# Patient Record
Sex: Female | Born: 1950 | Race: White | Hispanic: No | Marital: Married | Smoking: Former smoker
Health system: Southern US, Community
[De-identification: ages and names within clinical notes are randomized; demographics above are authoritative.]

## PROBLEM LIST (undated history)

## (undated) DIAGNOSIS — N809 Endometriosis, unspecified: Secondary | ICD-10-CM

## (undated) DIAGNOSIS — M810 Age-related osteoporosis without current pathological fracture: Secondary | ICD-10-CM

## (undated) DIAGNOSIS — J45909 Unspecified asthma, uncomplicated: Secondary | ICD-10-CM

## (undated) DIAGNOSIS — R7611 Nonspecific reaction to tuberculin skin test without active tuberculosis: Secondary | ICD-10-CM

## (undated) DIAGNOSIS — T7840XA Allergy, unspecified, initial encounter: Secondary | ICD-10-CM

## (undated) DIAGNOSIS — H9319 Tinnitus, unspecified ear: Secondary | ICD-10-CM

## (undated) DIAGNOSIS — K589 Irritable bowel syndrome without diarrhea: Secondary | ICD-10-CM

## (undated) DIAGNOSIS — Z973 Presence of spectacles and contact lenses: Secondary | ICD-10-CM

## (undated) DIAGNOSIS — G43909 Migraine, unspecified, not intractable, without status migrainosus: Secondary | ICD-10-CM

## (undated) DIAGNOSIS — G629 Polyneuropathy, unspecified: Secondary | ICD-10-CM

## (undated) DIAGNOSIS — Z889 Allergy status to unspecified drugs, medicaments and biological substances status: Secondary | ICD-10-CM

## (undated) DIAGNOSIS — M1990 Unspecified osteoarthritis, unspecified site: Secondary | ICD-10-CM

## (undated) DIAGNOSIS — J349 Unspecified disorder of nose and nasal sinuses: Secondary | ICD-10-CM

## (undated) HISTORY — PX: LARYNGOSCOPY: SUR817

## (undated) HISTORY — PX: ELBOW FRACTURE SURGERY: SHX616

## (undated) HISTORY — DX: Allergy, unspecified, initial encounter: T78.40XA

## (undated) HISTORY — PX: MULTIPLE TOOTH EXTRACTIONS: SHX2053

## (undated) HISTORY — DX: Unspecified asthma, uncomplicated: J45.909

## (undated) HISTORY — DX: Migraine, unspecified, not intractable, without status migrainosus: G43.909

## (undated) HISTORY — DX: Nonspecific reaction to tuberculin skin test without active tuberculosis: R76.11

## (undated) HISTORY — DX: Unspecified osteoarthritis, unspecified site: M19.90

## (undated) HISTORY — PX: COLONOSCOPY: SHX174

## (undated) HISTORY — PX: TONSILLECTOMY: SUR1361

## (undated) HISTORY — DX: Age-related osteoporosis without current pathological fracture: M81.0

## (undated) HISTORY — DX: Endometriosis, unspecified: N80.9

---

## 1979-08-26 HISTORY — PX: DILATION AND CURETTAGE OF UTERUS: SHX78

## 2004-08-25 HISTORY — PX: KNEE ARTHROSCOPY: SHX127

## 2004-08-25 HISTORY — PX: SHOULDER ARTHROSCOPY: SHX128

## 2009-12-01 ENCOUNTER — Ambulatory Visit: Payer: Self-pay | Admitting: Family Medicine

## 2009-12-01 DIAGNOSIS — J309 Allergic rhinitis, unspecified: Secondary | ICD-10-CM | POA: Insufficient documentation

## 2009-12-01 DIAGNOSIS — J019 Acute sinusitis, unspecified: Secondary | ICD-10-CM

## 2009-12-01 DIAGNOSIS — J45909 Unspecified asthma, uncomplicated: Secondary | ICD-10-CM | POA: Insufficient documentation

## 2010-09-24 NOTE — Assessment & Plan Note (Signed)
Summary: Cough/runny nose- greenish, SOB, sinus pressure x 2 wks rm 2   Vital Signs:  Patient Profile:   60 Years Old Female CC:      Cold & URI symptoms Height:     63.5 inches Weight:      175 pounds O2 Sat:      100 % O2 treatment:    Room Air Temp:     97.4 degrees F oral Pulse rate:   62 / minute Pulse rhythm:   regular Resp:     15 per minute BP sitting:   148 / 78  (right arm) Cuff size:   regular  Vitals Entered By: Areta Haber CMA (December 01, 2009 2:52 PM)                  Current Allergies (reviewed today): ! * ESTRADIOL    History of Present Illness History from: patient Reason for visit: bronchitis Chief Complaint: Cold & URI symptoms History of Present Illness: sinus congestion and postnasal green drainage and cough for several days,no fever,no smoking  Current Problems: ACUTE SINUSITIS, UNSPECIFIED (ICD-461.9) ASTHMA (ICD-493.90) ALLERGIC RHINITIS (ICD-477.9)   Current Meds SINGULAIR 10 MG TABS (MONTELUKAST SODIUM) 1 tab by mouth once daily HYDROCODONE-HOMATROPINE 5-1.5 MG/5ML SYRP (HYDROCODONE-HOMATROPINE) as directed ADVAIR DISKUS 250-50 MCG/DOSE AEPB (FLUTICASONE-SALMETEROL) 1-2 puffs once daily NASAL SPRAY 12 HOUR 0.05 % SOLN (OXYMETAZOLINE HCL) 1 spray each nostril once daily * AVELOX 400MG  one tab daily * ASTEPRO NS 0.15% 1 spray each nostril bid  REVIEW OF SYSTEMS Constitutional Symptoms      Denies fever, chills, night sweats, weight loss, weight gain, and fatigue.  Eyes       Denies change in vision, eye pain, eye discharge, glasses, contact lenses, and eye surgery. Ear/Nose/Throat/Mouth       Complains of frequent runny nose, sinus problems, and hoarseness.      Denies hearing loss/aids, change in hearing, ear pain, ear discharge, dizziness, frequent nose bleeds, sore throat, and tooth pain or bleeding.      Comments: green x  2 wks Respiratory       Complains of productive cough, wheezing, and shortness of breath.      Denies  asthma, bronchitis, and emphysema/COPD.  Cardiovascular       Denies murmurs, chest pain, and tires easily with exhertion.    Gastrointestinal       Denies stomach pain, nausea/vomiting, diarrhea, constipation, blood in bowel movements, and indigestion. Genitourniary       Denies painful urination, kidney stones, and loss of urinary control. Neurological       Denies paralysis, seizures, and fainting/blackouts. Musculoskeletal       Denies muscle pain, joint pain, joint stiffness, decreased range of motion, redness, swelling, muscle weakness, and gout.  Skin       Denies bruising, unusual mles/lumps or sores, and hair/skin or nail changes.  Psych       Denies mood changes, temper/anger issues, anxiety/stress, speech problems, depression, and sleep problems. Other Comments: greenish x 2 wks. Pt has not followed up w/PCP   Past History:  Past Medical History: Allergic rhinitis Asthma  Past Surgical History: Tonsillectomy L knee surgery R shoulder surgery  Family History: Family History of Cardiovascular disorder  Social History: Married Never Smoked Alcohol use-yes - 2 glasses of wine daily Drug use-no Regular exercise-no Smoking Status:  never Drug Use:  no Does Patient Exercise:  no Physical Exam General appearance: well developed, well nourished, no acute distress Head: normocephalic,  atraumatic Eyes: conjunctivae and lids normal Pupils: equal, round, reactive to light Ears: normal, no lesions or deformities Nasal: congested Oral/Pharynx: patchy hyperemia post Neck: neck supple,  trachea midline, no masses Thyroid: no nodules, masses, tenderness, or enlargement Chest/Lungs: clear Heart: regular rate and  rhythm, no murmur Assessment New Problems: ACUTE SINUSITIS, UNSPECIFIED (ICD-461.9) ASTHMA (ICD-493.90) ALLERGIC RHINITIS (ICD-477.9)   Patient Education: Patient and/or caregiver instructed in the following: rest, fluids.  Plan New  Medications/Changes: ASTEPRO NS 0.15% 1 spray each nostril bid  #1 x 0, 12/01/2009, Quita Skye Chandrika Sandles MD AVELOX 400MG  one tab daily  #7 x 0, 12/01/2009, Linna Hoff MD  New Orders: New Patient Level III [phone]  The patient and/or caregiver has been counseled thoroughly with regard to medications prescribed including dosage, schedule, interactions, rationale for use, and possible side effects and they verbalize understanding.  Diagnoses and expected course of recovery discussed and will return if not improved as expected or if the condition worsens. Patient and/or caregiver verbalized understanding.  Prescriptions: ASTEPRO NS 0.15% 1 spray each nostril bid  #1 x 0   Entered and Authorized by:   Linna Hoff MD   Signed by:   Linna Hoff MD on 12/01/2009   Method used:   Print then Give to Patient   RxID:   [id number] AVELOX 400MG  one tab daily  #7 x 0   Entered and Authorized by:   Linna Hoff MD   Signed by:   Linna Hoff MD on 12/01/2009   Method used:   Print then Give to Patient   RxID:   [id number]   Patient Instructions: 1)  drink lots of fluids and take all of  medicine as prescribed,see your doctor if further problems

## 2011-08-13 ENCOUNTER — Other Ambulatory Visit (HOSPITAL_COMMUNITY)
Admission: RE | Admit: 2011-08-13 | Discharge: 2011-08-13 | Disposition: A | Discharge status: Home / Self Care | Payer: 59 | Source: Ambulatory Visit | Attending: Family Medicine | Admitting: Family Medicine

## 2011-08-13 DIAGNOSIS — Z1159 Encounter for screening for other viral diseases: Secondary | ICD-10-CM | POA: Insufficient documentation

## 2011-08-13 DIAGNOSIS — Z124 Encounter for screening for malignant neoplasm of cervix: Secondary | ICD-10-CM | POA: Insufficient documentation

## 2012-12-30 ENCOUNTER — Encounter (HOSPITAL_BASED_OUTPATIENT_CLINIC_OR_DEPARTMENT_OTHER): Payer: Self-pay | Admitting: *Deleted

## 2012-12-30 NOTE — Progress Notes (Signed)
No cardiac or resp infections Long standing sinus and allergy problems

## 2013-01-04 ENCOUNTER — Encounter (HOSPITAL_BASED_OUTPATIENT_CLINIC_OR_DEPARTMENT_OTHER): Payer: Self-pay | Admitting: Anesthesiology

## 2013-01-04 ENCOUNTER — Ambulatory Visit (HOSPITAL_BASED_OUTPATIENT_CLINIC_OR_DEPARTMENT_OTHER): Payer: 59 | Admitting: Anesthesiology

## 2013-01-04 ENCOUNTER — Encounter (HOSPITAL_BASED_OUTPATIENT_CLINIC_OR_DEPARTMENT_OTHER)
Admission: RE | Disposition: A | Discharge status: Home / Self Care | Payer: Self-pay | Source: Ambulatory Visit | Attending: Otolaryngology

## 2013-01-04 ENCOUNTER — Ambulatory Visit (HOSPITAL_BASED_OUTPATIENT_CLINIC_OR_DEPARTMENT_OTHER)
Admission: RE | Admit: 2013-01-04 | Discharge: 2013-01-04 | Disposition: A | Discharge status: Home / Self Care | Payer: 59 | Source: Ambulatory Visit | Attending: Otolaryngology | Admitting: Otolaryngology

## 2013-01-04 ENCOUNTER — Encounter (HOSPITAL_BASED_OUTPATIENT_CLINIC_OR_DEPARTMENT_OTHER): Payer: Self-pay | Admitting: *Deleted

## 2013-01-04 DIAGNOSIS — J4489 Other specified chronic obstructive pulmonary disease: Secondary | ICD-10-CM | POA: Insufficient documentation

## 2013-01-04 DIAGNOSIS — J449 Chronic obstructive pulmonary disease, unspecified: Secondary | ICD-10-CM | POA: Insufficient documentation

## 2013-01-04 DIAGNOSIS — Z881 Allergy status to other antibiotic agents status: Secondary | ICD-10-CM | POA: Insufficient documentation

## 2013-01-04 DIAGNOSIS — J3489 Other specified disorders of nose and nasal sinuses: Secondary | ICD-10-CM | POA: Insufficient documentation

## 2013-01-04 DIAGNOSIS — Z888 Allergy status to other drugs, medicaments and biological substances status: Secondary | ICD-10-CM | POA: Insufficient documentation

## 2013-01-04 DIAGNOSIS — Z9889 Other specified postprocedural states: Secondary | ICD-10-CM

## 2013-01-04 DIAGNOSIS — M359 Systemic involvement of connective tissue, unspecified: Secondary | ICD-10-CM | POA: Insufficient documentation

## 2013-01-04 DIAGNOSIS — J343 Hypertrophy of nasal turbinates: Secondary | ICD-10-CM | POA: Insufficient documentation

## 2013-01-04 DIAGNOSIS — J342 Deviated nasal septum: Secondary | ICD-10-CM | POA: Insufficient documentation

## 2013-01-04 HISTORY — DX: Presence of spectacles and contact lenses: Z97.3

## 2013-01-04 HISTORY — DX: Unspecified disorder of nose and nasal sinuses: J34.9

## 2013-01-04 HISTORY — DX: Polyneuropathy, unspecified: G62.9

## 2013-01-04 HISTORY — PX: NASAL SEPTOPLASTY W/ TURBINOPLASTY: SHX2070

## 2013-01-04 HISTORY — DX: Tinnitus, unspecified ear: H93.19

## 2013-01-04 HISTORY — DX: Irritable bowel syndrome, unspecified: K58.9

## 2013-01-04 HISTORY — DX: Allergy status to unspecified drugs, medicaments and biological substances: Z88.9

## 2013-01-04 LAB — POCT HEMOGLOBIN-HEMACUE: Hemoglobin: 13.7 g/dL (ref 12.0–15.0)

## 2013-01-04 SURGERY — SEPTOPLASTY, NOSE, WITH NASAL TURBINATE REDUCTION
Anesthesia: General | Site: Nose | Laterality: Bilateral | Wound class: Clean Contaminated

## 2013-01-04 MED ORDER — ONDANSETRON HCL 4 MG/2ML IJ SOLN
INTRAMUSCULAR | Status: DC | PRN
  Administered 2013-01-04: 4 mg via INTRAVENOUS

## 2013-01-04 MED ORDER — DEXAMETHASONE SODIUM PHOSPHATE 4 MG/ML IJ SOLN
INTRAMUSCULAR | Status: DC | PRN
  Administered 2013-01-04: 10 mg via INTRAVENOUS

## 2013-01-04 MED ORDER — MIDAZOLAM HCL 5 MG/5ML IJ SOLN
INTRAMUSCULAR | Status: DC | PRN
  Administered 2013-01-04: 2 mg via INTRAVENOUS

## 2013-01-04 MED ORDER — EPHEDRINE SULFATE 50 MG/ML IJ SOLN
INTRAMUSCULAR | Status: DC | PRN
  Administered 2013-01-04: 10 mg via INTRAVENOUS

## 2013-01-04 MED ORDER — LIDOCAINE HCL (CARDIAC) 20 MG/ML IV SOLN
INTRAVENOUS | Status: DC | PRN
  Administered 2013-01-04: 50 mg via INTRAVENOUS

## 2013-01-04 MED ORDER — HYDROMORPHONE HCL PF 1 MG/ML IJ SOLN
0.2500 mg | INTRAMUSCULAR | Status: DC | PRN
  Administered 2013-01-04 (×4): 0.5 mg via INTRAVENOUS

## 2013-01-04 MED ORDER — MUPIROCIN 2 % EX OINT
TOPICAL_OINTMENT | CUTANEOUS | Status: DC | PRN
  Administered 2013-01-04: 1 via NASAL

## 2013-01-04 MED ORDER — LACTATED RINGERS IV SOLN
INTRAVENOUS | Status: DC
  Administered 2013-01-04 (×2): via INTRAVENOUS

## 2013-01-04 MED ORDER — OXYCODONE HCL 5 MG/5ML PO SOLN: 5.0000 mg | Freq: Once | ORAL | Status: DC | PRN

## 2013-01-04 MED ORDER — SUCCINYLCHOLINE CHLORIDE 20 MG/ML IJ SOLN
INTRAMUSCULAR | Status: DC | PRN
  Administered 2013-01-04: 100 mg via INTRAVENOUS

## 2013-01-04 MED ORDER — ONDANSETRON HCL 4 MG/2ML IJ SOLN: 4.0000 mg | Freq: Once | INTRAMUSCULAR | Status: DC | PRN

## 2013-01-04 MED ORDER — FENTANYL CITRATE 0.05 MG/ML IJ SOLN
INTRAMUSCULAR | Status: DC | PRN
  Administered 2013-01-04: 100 ug via INTRAVENOUS
  Administered 2013-01-04 (×2): 50 ug via INTRAVENOUS

## 2013-01-04 MED ORDER — LIDOCAINE-EPINEPHRINE 1 %-1:100000 IJ SOLN
INTRAMUSCULAR | Status: DC | PRN
  Administered 2013-01-04: 1.5 mg

## 2013-01-04 MED ORDER — AMOXICILLIN 875 MG PO TABS: 875.0000 mg | ORAL_TABLET | Freq: Two times a day (BID) | ORAL | Status: AC

## 2013-01-04 MED ORDER — OXYCODONE HCL 5 MG PO TABS: 5.0000 mg | ORAL_TABLET | Freq: Once | ORAL | Status: DC | PRN

## 2013-01-04 MED ORDER — OXYMETAZOLINE HCL 0.05 % NA SOLN
NASAL | Status: DC | PRN
  Administered 2013-01-04: 1 via NASAL

## 2013-01-04 MED ORDER — HYDROMORPHONE HCL PF 1 MG/ML IJ SOLN
0.5000 mg | INTRAMUSCULAR | Status: DC | PRN
  Administered 2013-01-04: 0.5 mg via INTRAVENOUS

## 2013-01-04 MED ORDER — OXYCODONE-ACETAMINOPHEN 5-325 MG PO TABS: 1.0000 | ORAL_TABLET | Freq: Four times a day (QID) | ORAL | Status: DC | PRN

## 2013-01-04 MED ORDER — LABETALOL HCL 5 MG/ML IV SOLN
INTRAVENOUS | Status: DC | PRN
  Administered 2013-01-04: 5 mg via INTRAVENOUS

## 2013-01-04 MED ORDER — PROPOFOL 10 MG/ML IV BOLUS
INTRAVENOUS | Status: DC | PRN
  Administered 2013-01-04: 200 mg via INTRAVENOUS

## 2013-01-04 MED ORDER — MEPERIDINE HCL 25 MG/ML IJ SOLN: 6.2500 mg | INTRAMUSCULAR | Status: DC | PRN

## 2013-01-04 SURGICAL SUPPLY — 33 items
ATTRACTOMAT 16X20 MAGNETIC DRP (DRAPES) IMPLANT
BLADE SURG 15 STRL LF DISP TIS (BLADE) IMPLANT
BLADE SURG 15 STRL SS (BLADE)
CANISTER SUCTION 1200CC (MISCELLANEOUS) ×2 IMPLANT
CLOTH BEACON ORANGE TIMEOUT ST (SAFETY) ×2 IMPLANT
COAGULATOR SUCT 8FR VV (MISCELLANEOUS) ×2 IMPLANT
DECANTER SPIKE VIAL GLASS SM (MISCELLANEOUS) IMPLANT
DRSG NASOPORE 8CM (GAUZE/BANDAGES/DRESSINGS) ×6 IMPLANT
DRSG TELFA 3X8 NADH (GAUZE/BANDAGES/DRESSINGS) IMPLANT
ELECT REM PT RETURN 9FT ADLT (ELECTROSURGICAL) ×2
ELECTRODE REM PT RTRN 9FT ADLT (ELECTROSURGICAL) ×1 IMPLANT
GAUZE SPONGE 4X4 12PLY STRL LF (GAUZE/BANDAGES/DRESSINGS) ×2 IMPLANT
GLOVE BIO SURGEON STRL SZ7.5 (GLOVE) ×2 IMPLANT
GLOVE SURG SS PI 7.0 STRL IVOR (GLOVE) ×2 IMPLANT
GOWN PREVENTION PLUS XLARGE (GOWN DISPOSABLE) ×4 IMPLANT
NEEDLE HYPO 25X1 1.5 SAFETY (NEEDLE) ×2 IMPLANT
NS IRRIG 1000ML POUR BTL (IV SOLUTION) ×2 IMPLANT
PACK BASIN DAY SURGERY FS (CUSTOM PROCEDURE TRAY) ×2 IMPLANT
PACK ENT DAY SURGERY (CUSTOM PROCEDURE TRAY) ×2 IMPLANT
SLEEVE SCD COMPRESS KNEE MED (MISCELLANEOUS) ×2 IMPLANT
SOLUTION BUTLER CLEAR DIP (MISCELLANEOUS) ×2 IMPLANT
SPLINT NASAL DOYLE BI-VL (GAUZE/BANDAGES/DRESSINGS) ×2 IMPLANT
SPONGE GAUZE 2X2 8PLY STRL LF (GAUZE/BANDAGES/DRESSINGS) ×2 IMPLANT
SPONGE NEURO XRAY DETECT 1X3 (DISPOSABLE) ×2 IMPLANT
SUT CHROMIC 4 0 P 3 18 (SUTURE) ×2 IMPLANT
SUT PLAIN 4 0 ~~LOC~~ 1 (SUTURE) ×2 IMPLANT
SUT PROLENE 3 0 PS 2 (SUTURE) ×2 IMPLANT
SUT VIC AB 4-0 P-3 18XBRD (SUTURE) IMPLANT
SUT VIC AB 4-0 P3 18 (SUTURE)
TOWEL OR 17X24 6PK STRL BLUE (TOWEL DISPOSABLE) ×2 IMPLANT
TUBE SALEM SUMP 12R W/ARV (TUBING) IMPLANT
TUBE SALEM SUMP 16 FR W/ARV (TUBING) ×2 IMPLANT
YANKAUER SUCT BULB TIP NO VENT (SUCTIONS) ×2 IMPLANT

## 2013-01-04 NOTE — H&P (Signed)
  H&P Update  Pt's original H&P dated 12/22/12 reviewed and placed in chart (to be scanned).  I personally examined the patient today.  No change in health. Proceed with septoplasty and bilateral partial inferior turbinate resection.

## 2013-01-04 NOTE — Anesthesia Procedure Notes (Signed)
Procedure Name: Intubation Date/Time: 01/04/2013 9:36 AM Performed by: Caren Macadam Pre-anesthesia Checklist: Patient identified, Emergency Drugs available, Suction available and Patient being monitored Patient Re-evaluated:Patient Re-evaluated prior to inductionOxygen Delivery Method: Circle System Utilized Preoxygenation: Pre-oxygenation with 100% oxygen Intubation Type: IV induction Ventilation: Mask ventilation without difficulty Laryngoscope Size: Miller and 2 Tube type: Oral Tube size: 7.0 mm Number of attempts: 1 Airway Equipment and Method: stylet and oral airway Placement Confirmation: ETT inserted through vocal cords under direct vision,  positive ETCO2 and breath sounds checked- equal and bilateral Secured at: 21 cm Tube secured with: Tape Dental Injury: Teeth and Oropharynx as per pre-operative assessment

## 2013-01-04 NOTE — Op Note (Signed)
DATE OF PROCEDURE: 01/04/2013  OPERATIVE REPORT   SURGEON: Newman Pies, MD   PREOPERATIVE DIAGNOSES:  1. Severe nasal septal deviation.  2. Bilateral inferior turbinate hypertrophy.  3. Chronic nasal obstruction.  POSTOPERATIVE DIAGNOSES:  1. Severe nasal septal deviation.  2. Bilateral inferior turbinate hypertrophy.  3. Chronic nasal obstruction.  PROCEDURE PERFORMED:  1. Septoplasty.  2. Bilateral partial inferior turbinate resection.   ANESTHESIA: General endotracheal tube anesthesia.   COMPLICATIONS: None.   ESTIMATED BLOOD LOSS: 300 mL.   INDICATION FOR PROCEDURE: Yvonne Lowe is a 62 y.o. female with a history of chronic nasal obstruction. The patient was treated with antihistamine, decongestant, steroid nasal spray, and systemic steroids. However, the patient continues to be symptomatic. On examination, the patient was noted to have bilateral severe inferior turbinate hypertrophy and significant nasal septal deviation, causing significant nasal obstruction. Based on the above findings, the decision was made for the patient to undergo the above-stated procedure. The risks, benefits, alternatives, and details of the procedure were discussed with the patient. Questions were invited and answered. Informed consent was obtained.   DESCRIPTION OF PROCEDURE: The patient was taken to the operating room and placed supine on the operating table. General endotracheal tube anesthesia was administered by the anesthesiologist. The patient was positioned, and prepped and draped in the standard fashion for nasal surgery. Pledgets soaked with Afrin were placed in both nasal cavities for decongestion. The pledgets were subsequently removed. The above mentioned severe septal deviation was again noted. 1% lidocaine with 1:100,000 epinephrine was injected onto the nasal septum bilaterally. A hemitransfixion incision was made on the left side. The mucosal flap was carefully elevated on the left side. A  cartilaginous incision was made 1 cm superior to the caudal margin of the nasal septum. Mucosal flap was also elevated on the right side in the similar fashion. It should be noted that due to the severe septal deviation, the deviated portion of the cartilaginous and bony septum had to be removed in piecemeal fashion. Once the deviated portions were removed, a straight midline septum was achieved. The septum was then quilted with 4-0 plain gut sutures. The hemitransfixion incision was closed with interrupted 4-0 chromic sutures.  The inferior one half of both hypertrophied inferior turbinate was crossclamped with a Kelly clamp. The inferior one half of each inferior turbinate was then resected with a pair of cross cutting scissors. Hemostasis was achieved with a suction cautery device.   The care of the patient was turned over to the anesthesiologist. The patient was awakened from anesthesia without difficulty. The patient was extubated and transferred to the recovery room in good condition.   OPERATIVE FINDINGS: Severe nasal septal deviation and bilateral inferior turbinate hypertrophy.   SPECIMEN: None.   FOLLOWUP CARE: The patient be discharged home once she is awake and alert. The patient will be placed on Percocet 1-2 tablets p.o. q.6 hours p.r.n. pain, and amoxicillin 875 mg p.o. b.i.d. for 5 days. The patient will follow up in my office in approximately 1 week for splint removal.   Deondrae Mcgrail Philomena Doheny, MD

## 2013-01-04 NOTE — Transfer of Care (Signed)
Immediate Anesthesia Transfer of Care Note  Patient: Yvonne Lowe  Procedure(s) Performed: Procedure(s): SEPTOPLASTY WITH BILATERAL TURBINATE RESECTION  (Bilateral)  Patient Location: PACU  Anesthesia Type:General  Level of Consciousness: awake and alert   Airway & Oxygen Therapy: Patient Spontanous Breathing and Patient connected to face mask oxygen  Post-op Assessment: Report given to PACU RN and Post -op Vital signs reviewed and stable  Post vital signs: Reviewed and stable  Complications: No apparent anesthesia complications

## 2013-01-04 NOTE — Anesthesia Preprocedure Evaluation (Signed)
Anesthesia Evaluation  Patient identified by MRN, date of birth, ID band Patient awake    Reviewed: Allergy & Precautions, H&P , NPO status , Patient's Chart, lab work & pertinent test results  Airway Mallampati: I  Neck ROM: full    Dental   Pulmonary          Cardiovascular     Neuro/Psych    GI/Hepatic   Endo/Other    Renal/GU      Musculoskeletal   Abdominal   Peds  Hematology   Anesthesia Other Findings   Reproductive/Obstetrics                           Anesthesia Physical Anesthesia Plan  ASA: II  Anesthesia Plan: General ETT   Post-op Pain Management:    Induction:   Airway Management Planned:   Additional Equipment:   Intra-op Plan:   Post-operative Plan:   Informed Consent: I have reviewed the patients History and Physical, chart, labs and discussed the procedure including the risks, benefits and alternatives for the proposed anesthesia with the patient or authorized representative who has indicated his/her understanding and acceptance.     Plan Discussed with: Anesthesiologist and Surgeon  Anesthesia Plan Comments:         Anesthesia Quick Evaluation  

## 2013-01-05 ENCOUNTER — Encounter (HOSPITAL_BASED_OUTPATIENT_CLINIC_OR_DEPARTMENT_OTHER): Payer: Self-pay | Admitting: Otolaryngology

## 2013-01-05 NOTE — Anesthesia Postprocedure Evaluation (Signed)
Anesthesia Post Note  Patient: Yvonne Lowe  Procedure(s) Performed: Procedure(s) (LRB): SEPTOPLASTY WITH BILATERAL TURBINATE RESECTION  (Bilateral)  Anesthesia type: general  Patient location: PACU  Post pain: Pain level controlled  Post assessment: Patient's Cardiovascular Status Stable  Last Vitals:  Filed Vitals:   01/04/13 1248  BP: 144/78  Pulse:   Temp: 36.6 C  Resp: 18    Post vital signs: Reviewed and stable  Level of consciousness: sedated  Complications: No apparent anesthesia complications

## 2013-02-01 ENCOUNTER — Ambulatory Visit (INDEPENDENT_AMBULATORY_CARE_PROVIDER_SITE_OTHER): Payer: 59 | Admitting: General Surgery

## 2013-02-15 ENCOUNTER — Encounter (INDEPENDENT_AMBULATORY_CARE_PROVIDER_SITE_OTHER): Payer: Self-pay | Admitting: General Surgery

## 2013-02-21 ENCOUNTER — Telehealth (INDEPENDENT_AMBULATORY_CARE_PROVIDER_SITE_OTHER): Payer: Self-pay

## 2013-02-21 ENCOUNTER — Ambulatory Visit (INDEPENDENT_AMBULATORY_CARE_PROVIDER_SITE_OTHER): Payer: 59 | Admitting: General Surgery

## 2013-02-21 NOTE — Telephone Encounter (Signed)
I called to see why the patient did not show for her appointment today and the person who answered told me I had the wrong #.  I had no other number available for the pt.

## 2013-03-04 ENCOUNTER — Encounter (INDEPENDENT_AMBULATORY_CARE_PROVIDER_SITE_OTHER): Payer: Self-pay | Admitting: General Surgery

## 2013-05-24 ENCOUNTER — Encounter (INDEPENDENT_AMBULATORY_CARE_PROVIDER_SITE_OTHER): Payer: Self-pay

## 2014-03-07 ENCOUNTER — Other Ambulatory Visit (HOSPITAL_COMMUNITY)
Admission: RE | Admit: 2014-03-07 | Discharge: 2014-03-07 | Disposition: A | Discharge status: Home / Self Care | Payer: 59 | Source: Ambulatory Visit | Attending: Family Medicine | Admitting: Family Medicine

## 2014-03-07 ENCOUNTER — Other Ambulatory Visit: Payer: Self-pay | Admitting: Family Medicine

## 2014-03-07 DIAGNOSIS — Z1151 Encounter for screening for human papillomavirus (HPV): Secondary | ICD-10-CM | POA: Insufficient documentation

## 2014-03-07 DIAGNOSIS — Z01419 Encounter for gynecological examination (general) (routine) without abnormal findings: Secondary | ICD-10-CM | POA: Insufficient documentation

## 2014-03-08 LAB — CYTOLOGY - PAP

## 2014-08-31 ENCOUNTER — Ambulatory Visit: Payer: Self-pay | Admitting: Podiatry

## 2014-10-24 ENCOUNTER — Ambulatory Visit: Payer: Self-pay | Admitting: Podiatry

## 2014-11-02 ENCOUNTER — Ambulatory Visit (INDEPENDENT_AMBULATORY_CARE_PROVIDER_SITE_OTHER): Payer: 59 | Admitting: Podiatry

## 2014-11-02 ENCOUNTER — Ambulatory Visit (INDEPENDENT_AMBULATORY_CARE_PROVIDER_SITE_OTHER): Payer: 59

## 2014-11-02 ENCOUNTER — Encounter: Payer: Self-pay | Admitting: Podiatry

## 2014-11-02 VITALS — BP 139/74 | HR 59 | Resp 16

## 2014-11-02 DIAGNOSIS — M722 Plantar fascial fibromatosis: Secondary | ICD-10-CM | POA: Diagnosis not present

## 2014-11-02 MED ORDER — METHYLPREDNISOLONE (PAK) 4 MG PO TABS: ORAL_TABLET | ORAL | Status: DC

## 2014-11-02 NOTE — Patient Instructions (Signed)

## 2014-11-02 NOTE — Progress Notes (Signed)
   Subjective:    Patient ID: Yvonne Lowe, female    DOB: 08/07/1951, 64 y.o.   MRN: [id number]  HPI Comments: "I have plantar fasciitis"  Patient c/o aching plantar heel right for several years. She saw podiatrist and said she had 2 bone spurs. She goes to massage therapist. She also has neuropathy-NOT diabetic. She does take neurotin. Wants another opinion.     Review of Systems  HENT: Positive for sinus pressure.   Respiratory: Positive for cough, chest tightness and shortness of breath.   Skin: Positive for rash.  All other systems reviewed and are negative.      Objective:   Physical Exam: I have reviewed her past history medications allergies surgery social history and review of systems reviewed pulses are strongly palpable bilateral. Neurologic sensorium is intact per Semmes-Weinstein monofilament. Deep tendon reflexes are intact bilateral muscle strength +5 over 5 dorsiflexion plantar flexors and inverters everters all intrinsic musculature is intact. Orthopedic evaluation demonstrates all joints distal to the ankle for range of motion that she does have pain on palpation of the medial continued tubercle of the right heel. She has no pain on medial lateral compression of the calcaneus and radiographs reviewed today do not demonstrate any type of osseus abnormalities other than a soft tissue increase in density the plantar fascial calcaneal insertion site.        Assessment & Plan:  Assessment: Chronic intractable plantar fasciitis right foot.  Plan: She refused any further injections at this point in time and having failed many other conservative therapies we are going to get herself started with orthotics. Also placed her in a night splint and plantar fascial brace. I dispensed a prescription for Medrol Dosepak to be taken as directed however she will discontinue the meloxicam during that time. She will reinitiate the meloxicam afterwards.

## 2014-11-29 ENCOUNTER — Ambulatory Visit: Payer: 59 | Admitting: *Deleted

## 2014-11-29 DIAGNOSIS — M722 Plantar fascial fibromatosis: Secondary | ICD-10-CM

## 2014-11-29 NOTE — Progress Notes (Signed)
Patient ID: Yvonne PenmanJeanie G Lowe, female   DOB: 05/07/1951, 64 y.o.   MRN: [id number] PICKING UP INSERTS

## 2014-11-29 NOTE — Patient Instructions (Signed)

## 2014-12-07 ENCOUNTER — Ambulatory Visit: Payer: 59 | Admitting: Podiatry

## 2014-12-28 ENCOUNTER — Ambulatory Visit: Payer: 59 | Admitting: Podiatry

## 2015-01-02 ENCOUNTER — Ambulatory Visit: Payer: 59 | Admitting: Podiatry

## 2015-01-12 DIAGNOSIS — M722 Plantar fascial fibromatosis: Secondary | ICD-10-CM

## 2016-09-03 ENCOUNTER — Other Ambulatory Visit: Payer: Self-pay | Admitting: Orthopedic Surgery

## 2016-09-03 DIAGNOSIS — M5416 Radiculopathy, lumbar region: Secondary | ICD-10-CM

## 2016-09-06 ENCOUNTER — Ambulatory Visit
Admission: RE | Admit: 2016-09-06 | Discharge: 2016-09-06 | Disposition: A | Discharge status: Home / Self Care | Payer: 59 | Source: Ambulatory Visit | Attending: Orthopedic Surgery | Admitting: Orthopedic Surgery

## 2016-09-06 DIAGNOSIS — M5416 Radiculopathy, lumbar region: Secondary | ICD-10-CM

## 2019-10-23 ENCOUNTER — Ambulatory Visit: Payer: Self-pay

## 2019-10-31 ENCOUNTER — Ambulatory Visit: Payer: Self-pay | Attending: Internal Medicine

## 2019-10-31 DIAGNOSIS — Z23 Encounter for immunization: Secondary | ICD-10-CM | POA: Insufficient documentation

## 2019-10-31 NOTE — Progress Notes (Signed)
   Covid-19 Vaccination Clinic  Name:  Yvonne Lowe    MRN: [id number] DOB: 06/09/1951  10/31/2019  [name] was observed post Covid-19 immunization for 15 minutes without incident. She was provided with Vaccine Information Sheet and instruction to access the V-Safe system.   [name] was instructed to call 911 with any severe reactions post vaccine: Marland Kitchen Difficulty breathing  . Swelling of face and throat  . A fast heartbeat  . A bad rash all over body  . Dizziness and weakness   Immunizations Administered    Name Date Dose VIS Date Route   Pfizer COVID-19 Vaccine 10/31/2019  3:27 PM 0.3 mL 08/05/2019 Intramuscular   Manufacturer: [employer], [employer]   Lot: [id number]   NDC: [address]-8

## 2019-11-30 ENCOUNTER — Ambulatory Visit: Payer: Self-pay | Attending: Internal Medicine

## 2019-11-30 DIAGNOSIS — Z23 Encounter for immunization: Secondary | ICD-10-CM

## 2019-11-30 NOTE — Progress Notes (Signed)
   Covid-19 Vaccination Clinic  Name:  Yvonne Lowe    MRN: [id number] DOB: 1951/05/25  11/30/2019  [name] was observed post Covid-19 immunization for 15 minutes without incident. She was provided with Vaccine Information Sheet and instruction to access the V-Safe system.   [name] was instructed to call 911 with any severe reactions post vaccine: Marland Kitchen Difficulty breathing  . Swelling of face and throat  . A fast heartbeat  . A bad rash all over body  . Dizziness and weakness   Immunizations Administered    Name Date Dose VIS Date Route   Pfizer COVID-19 Vaccine 11/30/2019 12:48 PM 0.3 mL 08/05/2019 Intramuscular   Manufacturer: [employer], [employer]   Lot: [id number]   NDC: [address]-6

## 2020-03-01 ENCOUNTER — Other Ambulatory Visit: Payer: Self-pay | Admitting: Family Medicine

## 2020-03-02 ENCOUNTER — Other Ambulatory Visit: Payer: Self-pay | Admitting: Family Medicine

## 2020-03-02 DIAGNOSIS — M25561 Pain in right knee: Secondary | ICD-10-CM

## 2020-03-28 ENCOUNTER — Ambulatory Visit
Admission: RE | Admit: 2020-03-28 | Discharge: 2020-03-28 | Disposition: A | Discharge status: Home / Self Care | Payer: 59 | Source: Ambulatory Visit | Attending: Family Medicine | Admitting: Family Medicine

## 2020-03-28 ENCOUNTER — Other Ambulatory Visit: Payer: Self-pay

## 2020-03-28 DIAGNOSIS — M25561 Pain in right knee: Secondary | ICD-10-CM

## 2020-06-23 ENCOUNTER — Ambulatory Visit: Payer: 59 | Attending: Internal Medicine

## 2020-06-23 DIAGNOSIS — Z23 Encounter for immunization: Secondary | ICD-10-CM

## 2020-06-23 NOTE — Progress Notes (Signed)
   Covid-19 Vaccination Clinic  Name:  HIDAYA DANIEL    MRN: [id number] DOB: October 03, 1950  06/23/2020  Ms. Spiker was observed post Covid-19 immunization for 15 minutes without incident. She was provided with Vaccine Information Sheet and instruction to access the V-Safe system.   Ms. Wire was instructed to call 911 with any severe reactions post vaccine: Marland Kitchen Difficulty breathing  . Swelling of face and throat  . A fast heartbeat  . A bad rash all over body  . Dizziness and weakness

## 2021-04-09 ENCOUNTER — Ambulatory Visit (INDEPENDENT_AMBULATORY_CARE_PROVIDER_SITE_OTHER): Payer: 59 | Admitting: Emergency Medicine

## 2021-04-09 ENCOUNTER — Ambulatory Visit (INDEPENDENT_AMBULATORY_CARE_PROVIDER_SITE_OTHER): Payer: 59

## 2021-04-09 ENCOUNTER — Other Ambulatory Visit: Payer: Self-pay

## 2021-04-09 ENCOUNTER — Encounter: Payer: Self-pay | Admitting: Emergency Medicine

## 2021-04-09 VITALS — BP 128/84 | HR 67 | Ht 63.0 in | Wt 165.6 lb

## 2021-04-09 DIAGNOSIS — J4530 Mild persistent asthma, uncomplicated: Secondary | ICD-10-CM

## 2021-04-09 DIAGNOSIS — R059 Cough, unspecified: Secondary | ICD-10-CM

## 2021-04-09 MED ORDER — PANTOPRAZOLE SODIUM 40 MG PO TBEC: 40.0000 mg | DELAYED_RELEASE_TABLET | Freq: Every day | ORAL | 5 refills | Status: DC

## 2021-04-09 NOTE — Patient Instructions (Signed)
Please continue Xyzal 5 mg once daily. Please continue Singulair 10 mg each evening. Stop Advair for now.  We may decide to replace this with an alternative in the future depending on how your breathing is doing. Keep your albuterol available to use 2 puffs when you need it for shortness of breath, chest tightness, wheezing. We will work on cough suppression and also treatment of the potential underlying contributors to sustained cough You may benefit from some diet modification to temporarily suppress reflux. We have included a GERD diet for you to review.  Start pantoprazole 40 mg once daily at least until her next visit.  Take this medication 1 hour around food Remember to avoid throat clearing if at all possible. Try using sugar-free candy and just swallow instead of clearing.  Use tessalon perles up to every 6 hours if needed for cough suppression We will perform pulmonary function testing at the next office visit Chest x-ray today It may be beneficial for [place] to refer you back to ENT to inspect her upper airway.  If you continue to have throat irritation, shortness of breath and cough then we may decide to do bronchoscopy to inspect your airways completely. Follow with Dr. Delton Coombes next available with full pulmonary function testing on the same day.

## 2021-04-09 NOTE — Assessment & Plan Note (Signed)
Her principal symptom is upper airway irritation, lots of throat clearing, cough with some associated shortness of breath.  She has poorly controlled rhinitis but tolerates nasal saline and nasal sprays poorly.  For now I do not think we will be able to impact with new medicines so I will continue the Singulair, Xyzal.  She may be willing to do nasal saline rinses at some point in the future.  Difficult to determine how much true asthma is present, will check PFT.  She has missed Advair in the past when she stopped it and may have true lower airways obstruction.  We are going to take a break from Advair for now since it is an upper airway irritant, use albuterol and see how her symptoms progress.  If we have to start back on schedule medication then I would choose an powdered formulation.  She denies significant or persistent GERD but I think it is reasonable to try treating empirically and she is willing to do so.  We will also check a chest x-ray today.  Please continue Xyzal 5 mg once daily. Please continue Singulair 10 mg each evening. Stop Advair for now.  We may decide to replace this with an alternative in the future depending on how your breathing is doing. Keep your albuterol available to use 2 puffs when you need it for shortness of breath, chest tightness, wheezing. We will work on cough suppression and also treatment of the potential underlying contributors to sustained cough You may benefit from some diet modification to temporarily suppress reflux. We have included a GERD diet for you to review.  Start pantoprazole 40 mg once daily at least until her next visit.  Take this medication 1 hour around food Remember to avoid throat clearing if at all possible. Try using sugar-free candy and just swallow instead of clearing.  Use tessalon perles up to every 6 hours if needed for cough suppression We will perform pulmonary function testing at the next office visit Chest x-ray today It may be  beneficial for [place] to refer you back to ENT to inspect her upper airway.  If you continue to have throat irritation, shortness of breath and cough then we may decide to do bronchoscopy to inspect your airways completely. Follow with Dr. Delton Coombes next available with full pulmonary function testing on the same day.

## 2021-04-09 NOTE — Progress Notes (Signed)
Subjective:    Patient ID: Yvonne Lowe, female    DOB: 08-15-1951, 70 y.o.   MRN: [id number]  HPI 70 year old former smoker (13 pack years) with a history of allergies, asthma/COPD that was diagnosed in her 74's in [place]. Was started on Advair years ago.  Past medical history also significant for IBS, endometriosis, migraines, positive PPD never rx for latent TB, then negative quant GOLD later. She has had turbinate surgery before. Occasional reflux, depends on what she eats.   She is experiencing lots of nasosinus congestion, chest congestion. Prod of white mucous. Coughs all day long. LOTS of throat clearing. She has exertional SOB or when she gets anxious, but she exercise, swims. More difficult climbing stairs. Can hear wheezing, rarely stridor. She has albuterol, uses every day, for chest heaviness - does get benefit.   Currently managed on Advair, has increased cough, congestion if she is off it.  Xyzal, Singulair. Not on PPI.    Review of Systems As per HPI  Past Medical History:  Diagnosis Date   Allergy    Arthritis    Asthma    Endometriosis    IBS (irritable bowel syndrome)    IBS (irritable bowel syndrome)    Migraine    Multiple allergies    Osteoporosis    Peripheral neuropathy    PPD positive    Sinus disorder    Tinnitus    Wears glasses      No family history on file.   Social History   Socioeconomic History   Marital status: Married    Spouse name: Not on file   Number of children: Not on file   Years of education: Not on file   Highest education level: Not on file  Occupational History   Not on file  Tobacco Use   Smoking status: Former   Smokeless tobacco: Never  Substance and Sexual Activity   Alcohol use: Yes    Comment: occ wine   Drug use: No   Sexual activity: Not on file  Other Topics Concern   Not on file  Social History Narrative   Not on file   Social Determinants of Health   Financial Resource Strain: Not on file  Food  Insecurity: Not on file  Transportation Needs: Not on file  Physical Activity: Not on file  Stress: Not on file  Social Connections: Not on file  Intimate Partner Violence: Not on file    Has lived in [place], [place], [place], [place], [place], [place] Was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  Hx positive PPD  Allergies  Allergen Reactions   Codeine     Headaches with larger doses   Erythromycin Nausea And Vomiting   Estradiol     REACTION: Paranoid   Venlafaxine Other (See Comments)    headaches     Outpatient Medications Prior to Visit  Medication Sig Dispense Refill   citalopram (CELEXA) 10 MG tablet Take 10 mg by mouth daily.     fluticasone-salmeterol (ADVAIR) 250-50 MCG/ACT AEPB Inhale 1 puff into the lungs 2 (two) times daily.     gabapentin (NEURONTIN) 300 MG capsule Take 300 mg by mouth 3 (three) times daily.     levocetirizine (XYZAL) 5 MG tablet Take 5 mg by mouth daily.     lubiprostone (AMITIZA) 24 MCG capsule Take 24 mcg by mouth 2 (two) times daily.     Magnesium Gluconate 550 MG TABS Take by mouth.     methocarbamol (ROBAXIN) 500 MG tablet Take  500 mg by mouth 2 (two) times daily as needed.     montelukast (SINGULAIR) 10 MG tablet Take 10 mg by mouth daily.     Multiple Vitamin (MULTIVITAMIN) capsule Take by mouth.     rosuvastatin (CRESTOR) 20 MG tablet Take 20 mg by mouth daily.     tiZANidine (ZANAFLEX) 4 MG tablet tizanidine 4 mg tablet     acetaminophen (TYLENOL) 325 MG tablet Take 650 mg by mouth every 6 (six) hours as needed for pain.     Calcium Citrate-Vitamin D (CALCIUM CITRATE + PO) Take by mouth.     Cholecalciferol (VITAMIN D-3 PO) Take by mouth.     Coenzyme Q10 (CO Q 10) 60 MG CAPS Take by mouth.     gabapentin (NEURONTIN) 300 MG capsule Take 300 mg by mouth 2 times daily at 12 noon and 4 pm.     L-Lysine 1000 MG TABS Take by mouth.     LEVOCETIRIZINE DIHYDROCHLORIDE PO Take by mouth.     magnesium 30 MG tablet Take 30 mg by mouth 2 (two) times daily.     meloxicam (MOBIC) 15 MG tablet  Take 15 mg by mouth daily.     methylPREDNIsolone (MEDROL DOSPACK) 4 MG tablet follow package directions 21 tablet 0   Multiple Vitamin (MULTIVITAMIN) capsule Take 1 capsule by mouth daily.     Naproxen (NAPROSYN PO) Take by mouth.     Omega 3-6-9 Fatty Acids (OMEGA 3-6-9 COMPLEX PO) Take by mouth.     No facility-administered medications prior to visit.         Objective:   Physical Exam  Vitals:   04/09/21 1426  BP: 128/84  Pulse: 67  SpO2: 99%  Weight: 165 lb 9.6 oz (75.1 kg)  Height: 5\' 3"  (1.6 m)   Gen: Pleasant, overwt woman, in no distress,  normal affect.  Nonstop throat clearing  ENT: No lesions,  mouth clear,  oropharynx clear, crowded posterior pharynx, active postnasal drip  Neck: No JVD, no stridor  Lungs: No use of accessory muscles, no crackles or wheezing on normal respiration, no wheeze on forced expiration  Cardiovascular: RRR, heart sounds normal, no murmur or gallops, no peripheral edema  Musculoskeletal: No deformities, no cyanosis or clubbing  Neuro: alert, awake, non focal  Skin: Warm, no lesions or rash     Assessment & Plan:   Asthma Her principal symptom is upper airway irritation, lots of throat clearing, cough with some associated shortness of breath.  She has poorly controlled rhinitis but tolerates nasal saline and nasal sprays poorly.  For now I do not think we will be able to impact with new medicines so I will continue the Singulair, Xyzal.  She may be willing to do nasal saline rinses at some point in the future.  Difficult to determine how much true asthma is present, will check PFT.  She has missed Advair in the past when she stopped it and may have true lower airways obstruction.  We are going to take a break from Advair for now since it is an upper airway irritant, use albuterol and see how her symptoms progress.  If we have to start back on schedule medication then I would choose an powdered formulation.  She denies significant or  persistent GERD but I think it is reasonable to try treating empirically and she is willing to do so.  We will also check a chest x-ray today.  Please continue Xyzal 5 mg once daily. Please continue  Singulair 10 mg each evening. Stop Advair for now.  We may decide to replace this with an alternative in the future depending on how your breathing is doing. Keep your albuterol available to use 2 puffs when you need it for shortness of breath, chest tightness, wheezing. We will work on cough suppression and also treatment of the potential underlying contributors to sustained cough You may benefit from some diet modification to temporarily suppress reflux. We have included a GERD diet for you to review.  Start pantoprazole 40 mg once daily at least until her next visit.  Take this medication 1 hour around food Remember to avoid throat clearing if at all possible. Try using sugar-free candy and just swallow instead of clearing.  Use tessalon perles up to every 6 hours if needed for cough suppression We will perform pulmonary function testing at the next office visit Chest x-ray today It may be beneficial for [place] to refer you back to ENT to inspect her upper airway.  If you continue to have throat irritation, shortness of breath and cough then we may decide to do bronchoscopy to inspect your airways completely. Follow with Dr. Delton Coombes next available with full pulmonary function testing on the same day.    Levy Pupa, MD, PhD 04/09/2021, 3:10 PM [facility] [phone] or if no answer before 7:00PM call [phone] For any issues after 7:00PM please call eLink [phone]

## 2021-04-30 ENCOUNTER — Encounter: Payer: Self-pay | Admitting: *Deleted

## 2021-05-23 ENCOUNTER — Ambulatory Visit (INDEPENDENT_AMBULATORY_CARE_PROVIDER_SITE_OTHER): Payer: 59 | Admitting: Emergency Medicine

## 2021-05-23 ENCOUNTER — Other Ambulatory Visit: Payer: Self-pay

## 2021-05-23 ENCOUNTER — Encounter: Payer: Self-pay | Admitting: Emergency Medicine

## 2021-05-23 DIAGNOSIS — R059 Cough, unspecified: Secondary | ICD-10-CM

## 2021-05-23 DIAGNOSIS — R058 Other specified cough: Secondary | ICD-10-CM | POA: Insufficient documentation

## 2021-05-23 LAB — PULMONARY FUNCTION TEST
DL/VA % pred: 91 %
DL/VA: 3.82 ml/min/mmHg/L
DLCO cor % pred: 99 %
DLCO cor: 18.67 ml/min/mmHg
DLCO unc % pred: 99 %
DLCO unc: 18.67 ml/min/mmHg
FEF 25-75 Post: 2.61 L/sec
FEF 25-75 Pre: 2.1 L/sec
FEF2575-%Change-Post: 24 %
FEF2575-%Pred-Post: 149 %
FEF2575-%Pred-Pre: 119 %
FEV1-%Change-Post: 3 %
FEV1-%Pred-Post: 125 %
FEV1-%Pred-Pre: 121 %
FEV1-Post: 2.65 L
FEV1-Pre: 2.56 L
FEV1FVC-%Change-Post: 7 %
FEV1FVC-%Pred-Pre: 102 %
FEV6-%Change-Post: -1 %
FEV6-%Pred-Post: 120 %
FEV6-%Pred-Pre: 122 %
FEV6-Post: 3.21 L
FEV6-Pre: 3.27 L
FEV6FVC-%Change-Post: 1 %
FEV6FVC-%Pred-Post: 104 %
FEV6FVC-%Pred-Pre: 103 %
FVC-%Change-Post: -3 %
FVC-%Pred-Post: 114 %
FVC-%Pred-Pre: 118 %
FVC-Post: 3.21 L
FVC-Pre: 3.32 L
Post FEV1/FVC ratio: 83 %
Post FEV6/FVC ratio: 100 %
Pre FEV1/FVC ratio: 77 %
Pre FEV6/FVC Ratio: 99 %
RV % pred: 116 %
RV: 2.54 L
TLC % pred: 125 %
TLC: 6.14 L

## 2021-05-23 MED ORDER — ATROVENT HFA 17 MCG/ACT IN AERS: 2.0000 | INHALATION_SPRAY | Freq: Four times a day (QID) | RESPIRATORY_TRACT | 5 refills | Status: DC | PRN

## 2021-05-23 MED ORDER — PREDNISONE 10 MG PO TABS: 20.0000 mg | ORAL_TABLET | Freq: Every day | ORAL | 0 refills | Status: AC

## 2021-05-23 MED ORDER — AZITHROMYCIN 250 MG PO TABS: 250.0000 mg | ORAL_TABLET | Freq: Every day | ORAL | 0 refills | Status: DC

## 2021-05-23 NOTE — Progress Notes (Signed)
Subjective:    Patient ID: Yvonne Lowe, female    DOB: Feb 20, 1951, 70 y.o.   MRN: [id number]  HPI 70 year old former smoker (13 pack years) with a history of allergies, asthma/COPD that was diagnosed in her 80's in [place]. Was started on Advair years ago.  Past medical history also significant for IBS, endometriosis, migraines, positive PPD never rx for latent TB, then negative quant GOLD later. She has had turbinate surgery before. Occasional reflux, depends on what she eats.   She is experiencing lots of nasosinus congestion, chest congestion. Prod of white mucous. Coughs all day long. LOTS of throat clearing. She has exertional SOB or when she gets anxious, but she exercise, swims. More difficult climbing stairs. Can hear wheezing, rarely stridor. She has albuterol, uses every day, for chest heaviness - does get benefit.   Currently managed on Advair, has increased cough, congestion if she is off it.  Xyzal, Singulair. Not on PPI.   ROV 05/23/21 --70 year old woman, former smoker with allergic rhinitis, past diagnosis of asthma/COPD, IBS, endometriosis, migraines.  She was seen in August for chronic cough.  She is been managed on Advair, Xyzal, Singulair. At her initial visit I tried stopping her Advair to see if she would tolerate.  Tried cough suppression and started pantoprazole.  Chest x-ray done 04/10/2021 was reassuring. Her cough got better. She still has a lot of nasal drainage.    Review of Systems As per HPI  Past Medical History:  Diagnosis Date   Allergy    Arthritis    Asthma    Endometriosis    IBS (irritable bowel syndrome)    IBS (irritable bowel syndrome)    Migraine    Multiple allergies    Osteoporosis    Peripheral neuropathy    PPD positive    Sinus disorder    Tinnitus    Wears glasses      No family history on file.   Social History   Socioeconomic History   Marital status: Married    Spouse name: Not on file   Number of children: Not on file    Years of education: Not on file   Highest education level: Not on file  Occupational History   Not on file  Tobacco Use   Smoking status: Former   Smokeless tobacco: Never  Substance and Sexual Activity   Alcohol use: Yes    Comment: occ wine   Drug use: No   Sexual activity: Not on file  Other Topics Concern   Not on file  Social History Narrative   Not on file   Social Determinants of Health   Financial Resource Strain: Not on file  Food Insecurity: Not on file  Transportation Needs: Not on file  Physical Activity: Not on file  Stress: Not on file  Social Connections: Not on file  Intimate Partner Violence: Not on file    Has lived in [place], [place], [place], [place], [place], [place], [place] Was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  Hx positive PPD  Allergies  Allergen Reactions   Codeine     Headaches with larger doses   Erythromycin Nausea And Vomiting   Estradiol     REACTION: Paranoid   Venlafaxine Other (See Comments)    headaches     Outpatient Medications Prior to Visit  Medication Sig Dispense Refill   citalopram (CELEXA) 10 MG tablet Take 10 mg by mouth daily.     gabapentin (NEURONTIN) 300 MG capsule Take 300 mg by mouth 3 (  three) times daily.     lubiprostone (AMITIZA) 24 MCG capsule Take 24 mcg by mouth 2 (two) times daily.     Magnesium Gluconate 550 MG TABS Take by mouth.     methocarbamol (ROBAXIN) 500 MG tablet Take 500 mg by mouth 2 (two) times daily as needed.     montelukast (SINGULAIR) 10 MG tablet Take 10 mg by mouth daily.     Multiple Vitamin (MULTIVITAMIN) capsule Take by mouth.     pantoprazole (PROTONIX) 40 MG tablet Take 1 tablet (40 mg total) by mouth daily. 30 tablet 5   rosuvastatin (CRESTOR) 20 MG tablet Take 20 mg by mouth daily.     tiZANidine (ZANAFLEX) 4 MG tablet tizanidine 4 mg tablet     albuterol (PROVENTIL) (2.5 MG/3ML) 0.083% nebulizer solution Take 2.5 mg by nebulization every 6 (six) hours as needed.     albuterol (VENTOLIN HFA) 108 (90 Base) MCG/ACT inhaler albuterol  sulfate HFA 90 mcg/actuation aerosol inhaler  TAKE 2 PUFFS BY MOUTH EVERY 4 HOURS AS NEEDED     fluticasone-salmeterol (ADVAIR) 250-50 MCG/ACT AEPB Inhale 1 puff into the lungs 2 (two) times daily. (Patient not taking: Reported on 05/23/2021)     levocetirizine (XYZAL) 5 MG tablet Take 5 mg by mouth daily. (Patient not taking: Reported on 05/23/2021)     No facility-administered medications prior to visit.         Objective:   Physical Exam  Vitals:   05/23/21 1606  BP: (!) 144/70  Pulse: 67  Temp: 98.3 F (36.8 C)  TempSrc: Oral  SpO2: 99%  Weight: 169 lb 12.8 oz (77 kg)  Height: 5\' 3"  (1.6 m)   Gen: Pleasant, overwt woman, in no distress,  normal affect.  Nonstop throat clearing and dry cough  ENT: No lesions,  mouth clear,  oropharynx clear, crowded posterior pharynx, active postnasal drip  Neck: No JVD, no stridor  Lungs: No use of accessory muscles, no crackles or wheezing on normal respiration, no wheeze on forced expiration  Cardiovascular: RRR, heart sounds normal, no murmur or gallops, no peripheral edema  Musculoskeletal: No deformities, no cyanosis or clubbing  Neuro: alert, awake, non focal  Skin: Warm, no lesions or rash     Assessment & Plan:   Upper airway cough syndrome No evidence of obstructive lung disease on her pulmonary function testing.  Her clinical findings and symptoms are all consistent with upper airway irritation syndrome.  She did get some benefit from starting pantoprazole.  She still has a lot of nasal congestion and drainage even on Xyzal and Singulair.  We will try starting Atrovent nasal spray to see if we can decrease her vasomotor rhinitis and thereby help her upper airway irritation.  No plans to restart Advair as I suspect it was an upper airway irritant.  She can keep albuterol available to use if she needs it but I do not believe this is asthma.  Discussed cough suppression, throat avoiding throat clearing, voice rest with her.  She  is flaring right now and I will give her a brief course of prednisone, azithromycin.  Depending on how her cough progresses we will decide whether she needs bronchoscopy and airway inspection.   , MD, PhD 05/23/2021, 4:40 PM [facility] [phone] or if no answer before 7:00PM call [phone] For any issues after 7:00PM please call eLink [phone]

## 2021-05-23 NOTE — Addendum Note (Signed)
Addended by: Bradd Canary L on: 05/23/2021 05:04 PM   Modules accepted: Orders

## 2021-05-23 NOTE — Patient Instructions (Addendum)
We reviewed your pulmonary function testing today.  This does not show any evidence to support asthma.  Good news. We will not restart Advair Keep your albuterol available to use 2 puffs if needed for shortness of breath, chest tightness, wheezing. Take prednisone 20 mg daily for 5 days. Take azithromycin as directed until completely gone. Please continue levocetirizine and Singulair as you have been taking them. Please start Atrovent nasal spray, 2 sprays each nostril 2-3 times daily as you need it for nasal congestion and drainage. Continue the pantoprazole 40 mg once daily.  Take this medication 1 hour around food. Do your very best to avoid throat clearing. Okay to continue using Tessalon Perles up to every 6 hours to suppress your cough if needed. Follow with Dr Delton Coombes in 6 months or sooner if you have any problems

## 2021-05-23 NOTE — Assessment & Plan Note (Addendum)
No evidence of obstructive lung disease on her pulmonary function testing.  Her clinical findings and symptoms are all consistent with upper airway irritation syndrome.  She did get some benefit from starting pantoprazole.  She still has a lot of nasal congestion and drainage even on Xyzal and Singulair.  We will try starting Atrovent nasal spray to see if we can decrease her vasomotor rhinitis and thereby help her upper airway irritation.  No plans to restart Advair as I suspect it was an upper airway irritant.  She can keep albuterol available to use if she needs it but I do not believe this is asthma.  Discussed cough suppression, throat avoiding throat clearing, voice rest with her.  She is flaring right now and I will give her a brief course of prednisone, azithromycin.  Depending on how her cough progresses we will decide whether she needs bronchoscopy and airway inspection.

## 2021-05-23 NOTE — Progress Notes (Signed)
PFT done today. 

## 2021-05-30 ENCOUNTER — Other Ambulatory Visit: Payer: Self-pay | Admitting: Emergency Medicine

## 2021-05-31 NOTE — Telephone Encounter (Signed)
This is correct it should have been the Atrovent nasal spray. It appears the Atrovent inhaler was sent by accident.   Call made to patient, confirmed DOB. Made aware she was correct it should have been the nasal spray. Patient states she used the Atrovent inhaler and noticed an immediate relief from her SOB. She states she paid $396. I made her aware next time to call us before paying that amount of money for any medication. She states she pays $600 for another medication every month. I told her next time to be sure to call us before paying that much.  She said she would let us know whether she decides to stay with it since it costs so much. I made her aware to be sure not to use both the atrovent and albuterol since they are both short acting bronchodilators. Voiced understanding.   RB please advise if okay for patient to use atrovent instead of albuterol since she has paid for it. And is if she chooses can she stay on it? Thanks :) I will go ahead and send Atrovent nasal spray.

## 2021-06-06 ENCOUNTER — Telehealth: Payer: Self-pay | Admitting: Emergency Medicine

## 2021-06-06 MED ORDER — IPRATROPIUM BROMIDE 0.03 % NA SOLN: 2.0000 | Freq: Two times a day (BID) | NASAL | 12 refills | Status: DC

## 2021-06-06 NOTE — Telephone Encounter (Signed)
Called and spoke with pt letting her know that the med was supposed to have been atrovent nasal spray, not the atrovent inhaler and let her know that I had sent the correct med to her pharmacy. Pt verbalized understanding. Nothing further needed.

## 2021-06-12 NOTE — Telephone Encounter (Signed)
See phone note from 06/06/21.

## 2021-07-20 ENCOUNTER — Other Ambulatory Visit: Payer: Self-pay | Admitting: Emergency Medicine

## 2021-11-18 ENCOUNTER — Telehealth: Payer: Self-pay | Admitting: Emergency Medicine

## 2021-11-18 NOTE — Telephone Encounter (Signed)
Fax received from Dr. Gean Birchwood with [facility] to perform a RIght knee arthroscopy on patient.  Patient needs surgery clearance. Patient was seen on 05/23/2021. Patient has been scheduled for 11/18/2021. Office protocol is a risk assessment can be sent to surgeon if patient has been seen in 60 days or less.  ? ?Sending to Dr. Delton Coombes for risk assessment or recommendations if patient needs to be seen in office prior to surgical procedure.   ?

## 2021-11-19 ENCOUNTER — Ambulatory Visit (INDEPENDENT_AMBULATORY_CARE_PROVIDER_SITE_OTHER): Payer: 59 | Admitting: Emergency Medicine

## 2021-11-19 ENCOUNTER — Other Ambulatory Visit: Payer: Self-pay

## 2021-11-19 ENCOUNTER — Encounter: Payer: Self-pay | Admitting: Emergency Medicine

## 2021-11-19 DIAGNOSIS — R058 Other specified cough: Secondary | ICD-10-CM

## 2021-11-19 NOTE — Progress Notes (Signed)
? ?Subjective:  ? ? Patient ID: Yvonne Lowe, female    DOB: [birth year], 71 y.o.   MRN: [id number] ? ?HPI ? ?ROV 11/19/21 --follow-up visit 71 year old woman, former smoker with chronic cough.  Past medical history also significant for IBS, endometriosis, migraines.  She has allergic rhinitis that is a contributor to her cough.  Her pulmonary function testing is reassuring so I stopped her bronchodilator therapy.  She is planning for knee surgery coming up in ?She reports that her nasal atrovent spray has helped her. She is on xyzal, singulair, phenylephrine. Remains on Protonix. She does have daily cough but better. Rarely uses albuterol, sometimes for cough. No flares or abx.  ? ? ?Review of Systems ?As per HPI ? ?Past Medical History:  ?Diagnosis Date  ? Allergy   ? Arthritis   ? Asthma   ? Endometriosis   ? IBS (irritable bowel syndrome)   ? IBS (irritable bowel syndrome)   ? Migraine   ? Multiple allergies   ? Osteoporosis   ? Peripheral neuropathy   ? PPD positive   ? Sinus disorder   ? Tinnitus   ? Wears glasses   ?  ? ?History reviewed. No pertinent family history.  ? ?Social History  ? ?Socioeconomic History  ? Marital status: Married  ?  Spouse name: Not on file  ? Number of children: Not on file  ? Years of education: Not on file  ? Highest education level: Not on file  ?Occupational History  ? Not on file  ?Tobacco Use  ? Smoking status: Former  ?  Packs/day: 1.00  ?  Types: Cigarettes  ?  Quit date: 32  ?  Years since quitting: 35.2  ? Smokeless tobacco: Never  ?Vaping Use  ? Vaping Use: Never used  ?Substance and Sexual Activity  ? Alcohol use: Yes  ?  Comment: occ wine  ? Drug use: No  ? Sexual activity: Not on file  ?Other Topics Concern  ? Not on file  ?Social History Narrative  ? Not on file  ? ?Social Determinants of Health  ? ?Financial Resource Strain: Not on file  ?Food Insecurity: Not on file  ?Transportation Needs: Not on file  ?Physical Activity: Not on file  ?Stress: Not on file  ?Social  Connections: Not on file  ?Intimate Partner Violence: Not on file  ?  ?Has lived in [place], [place], [place], [place], [place], [place] ?Was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  ?Hx positive PPD ? ?Allergies  ?Allergen Reactions  ? Codeine   ?  Headaches with larger doses  ? Erythromycin Nausea And Vomiting  ? Estradiol   ?  REACTION: Paranoid  ? Venlafaxine Other (See Comments)  ?  headaches  ?  ? ?Outpatient Medications Prior to Visit  ?Medication Sig Dispense Refill  ? albuterol (PROVENTIL) (2.5 MG/3ML) 0.083% nebulizer solution Take 2.5 mg by nebulization every 6 (six) hours as needed.    ? albuterol (VENTOLIN HFA) 108 (90 Base) MCG/ACT inhaler albuterol sulfate HFA 90 mcg/actuation aerosol inhaler ? TAKE 2 PUFFS BY MOUTH EVERY 4 HOURS AS NEEDED    ? azithromycin (ZITHROMAX) 250 MG tablet Take 1 tablet (250 mg total) by mouth daily. 6 tablet 0  ? citalopram (CELEXA) 10 MG tablet Take 10 mg by mouth daily.    ? gabapentin (NEURONTIN) 300 MG capsule Take 300 mg by mouth 3 (three) times daily.    ? ipratropium (ATROVENT) 0.03 % nasal spray Place 2 sprays into both nostrils every  12 (twelve) hours. 30 mL 12  ? levocetirizine (XYZAL) 5 MG tablet Take 5 mg by mouth daily.    ? lubiprostone (AMITIZA) 24 MCG capsule Take 24 mcg by mouth 2 (two) times daily.    ? Magnesium Gluconate 550 MG TABS Take by mouth.    ? methocarbamol (ROBAXIN) 500 MG tablet Take 500 mg by mouth 2 (two) times daily as needed.    ? montelukast (SINGULAIR) 10 MG tablet Take 10 mg by mouth daily.    ? Multiple Vitamin (MULTIVITAMIN) capsule Take by mouth.    ? pantoprazole (PROTONIX) 40 MG tablet TAKE 1 TABLET BY MOUTH EVERY DAY 90 tablet 1  ? rosuvastatin (CRESTOR) 20 MG tablet Take 20 mg by mouth daily.    ? tiZANidine (ZANAFLEX) 4 MG tablet tizanidine 4 mg tablet    ? ?No facility-administered medications prior to visit.  ? ? ? ? ?   ?Objective:  ? Physical Exam ? ?Vitals:  ? 11/19/21 1538  ?BP: 130/70  ?Pulse: 76  ?Temp: 98.3 ?F (36.8 ?C)  ?TempSrc: Oral  ?SpO2: 97%  ?Weight: 170 lb  (77.1 kg)  ?Height: 5\' 3"  (1.6 m)  ? ?Gen: Pleasant, overwt woman, in no distress,  normal affect. Less coughing ? ?ENT: No lesions,  mouth clear,  oropharynx clear, stronger voice today ? ?Neck: No JVD, no stridor ? ?Lungs: No use of accessory muscles, no crackles or wheezing on normal respiration, no wheeze on forced expiration ? ?Cardiovascular: RRR, heart sounds normal, no murmur or gallops, no peripheral edema ? ?Musculoskeletal: No deformities, no cyanosis or clubbing ? ?Neuro: alert, awake, non focal ? ?Skin: Warm, no lesions or rash ? ?   ?Assessment & Plan:  ? ?Upper airway cough syndrome ?No evidence of asthma on her pulmonary function testing.  She has been clinically stable, no flares.  She does have daily baseline cough but is on a good medical regimen to control GERD and control rhinitis to the best of her ability.  She does still have breakthrough symptoms.  Plan to continue her current regimen.  Hold off on any maintenance bronchodilator therapy in absence of asthma.  She does have albuterol available she can use if she has difficulty clearing secretions or flares her cough. ? ?She is at low risk for general anesthesia or orthopedic surgery.  No hesitation in proceeding. ? ? ?Please continue your Xyzal, Singulair and Atrovent nasal spray as you have been taking it. ?Continue to use phenylephrine/chlorpheniramine as needed ?Continue your Protonix as you have been taking it. ?You are at low risk for any operative complications from your upcoming knee surgery.  No hesitation in proceeding.  We will include this assessment and your office note today so we will be available for Dr. . ?Follow with Dr. Turner Daniels in 12 months or sooner if you have any problems.  ? ? ?Delton Coombes, MD, PhD ?11/19/2021, 4:02 PM ?[facility] ?[phone] or if no answer before 7:00PM call [phone] ?For any issues after 7:00PM please call eLink [phone] ? ?

## 2021-11-19 NOTE — Patient Instructions (Addendum)
Please continue your Xyzal, Singulair and Atrovent nasal spray as you have been taking it. ?Continue to use phenylephrine/chlorpheniramine as needed ?Continue your Protonix as you have been taking it. ?You are at low risk for any operative complications from your upcoming knee surgery.  No hesitation in proceeding.  We will include this assessment and your office note today so we will be available for Dr. Turner Daniels. ?Follow with Dr. Delton Coombes in 12 months or sooner if you have any problems.  ? ?

## 2021-11-19 NOTE — Telephone Encounter (Signed)
Routing to surgical pool.  ? ?

## 2021-11-19 NOTE — Assessment & Plan Note (Signed)
No evidence of asthma on her pulmonary function testing.  She has been clinically stable, no flares.  She does have daily baseline cough but is on a good medical regimen to control GERD and control rhinitis to the best of her ability.  She does still have breakthrough symptoms.  Plan to continue her current regimen.  Hold off on any maintenance bronchodilator therapy in absence of asthma.  She does have albuterol available she can use if she has difficulty clearing secretions or flares her cough. ? ?She is at low risk for general anesthesia or orthopedic surgery.  No hesitation in proceeding. ? ? ?Please continue your Xyzal, Singulair and Atrovent nasal spray as you have been taking it. ?Continue to use phenylephrine/chlorpheniramine as needed ?Continue your Protonix as you have been taking it. ?You are at low risk for any operative complications from your upcoming knee surgery.  No hesitation in proceeding.  We will include this assessment and your office note today so we will be available for Dr. Turner Daniels. ?Follow with Dr. Delton Coombes in 12 months or sooner if you have any problems.  ?

## 2021-11-19 NOTE — Telephone Encounter (Signed)
She is low risk for complications from surgery from a pulmonary standpoint. Would document this in a letter for her I guess since she was seen > 60 days ago? ?

## 2021-11-20 NOTE — Telephone Encounter (Signed)
OV notes and clearance form have been faxed back to [facility]. Nothing further needed at this time.  

## 2022-01-09 ENCOUNTER — Other Ambulatory Visit: Payer: Self-pay | Admitting: Emergency Medicine

## 2022-02-26 ENCOUNTER — Telehealth: Payer: Self-pay | Admitting: Emergency Medicine

## 2022-02-26 NOTE — Telephone Encounter (Signed)
Bevelyn Ngo, NP  Christen Butter, CMA Caller: Unspecified (Today,  2:57 PM) Can you send in TessLon. 15 tabs then she needs to get them from her allergist.   Called the pt and there was no answer- LMTCB. Also, need to verify her pharmacy.

## 2022-02-26 NOTE — Telephone Encounter (Signed)
Spoke with the pt  She is c/o continued cough since the last visit  She states has been taking tessalon prescribed by her allergist a while back  She is not able to reach them to refill and [place] She is taking everything Dr Delton Coombes rec and says this is not helping and that tessalon rx is needed  She denies any SOB, wheezing, fevers, aches, chest tightness She has cough with clear sputum She said she usually takes tessalon 100 mg bid and then had the 200 mg dose if needed in between  Not on her med list  Please advise if we can send and what dose to send, thanks!  Allergies  Allergen Reactions   Codeine     Headaches with larger doses   Erythromycin Nausea And Vomiting   Estradiol     REACTION: Paranoid   Venlafaxine Other (See Comments)    headaches

## 2022-02-27 NOTE — Telephone Encounter (Signed)
Called and spoke with patient who was requesting a refill of Tesslon. Advised her that we could send in RX but any further refills will need to be from her allergist. Patient expressed understanding but states that she got a prescription already from her PCP. I expressed understanding. Nothing further needed at this time.

## 2022-05-05 ENCOUNTER — Encounter (HOSPITAL_COMMUNITY): Payer: Self-pay

## 2022-05-05 ENCOUNTER — Other Ambulatory Visit: Payer: Self-pay

## 2022-05-05 ENCOUNTER — Emergency Department (HOSPITAL_COMMUNITY)
Admission: EM | Admit: 2022-05-05 | Discharge: 2022-05-05 | Disposition: A | Discharge status: Home / Self Care | Payer: 59 | Attending: Emergency Medicine | Admitting: Emergency Medicine

## 2022-05-05 DIAGNOSIS — K644 Residual hemorrhoidal skin tags: Secondary | ICD-10-CM

## 2022-05-05 DIAGNOSIS — Z87891 Personal history of nicotine dependence: Secondary | ICD-10-CM | POA: Insufficient documentation

## 2022-05-05 DIAGNOSIS — J45909 Unspecified asthma, uncomplicated: Secondary | ICD-10-CM | POA: Diagnosis not present

## 2022-05-05 DIAGNOSIS — K6289 Other specified diseases of anus and rectum: Secondary | ICD-10-CM | POA: Diagnosis present

## 2022-05-05 DIAGNOSIS — K648 Other hemorrhoids: Secondary | ICD-10-CM | POA: Diagnosis not present

## 2022-05-05 MED ORDER — NIFEDIPINE 0.3 % OINTMENT: 1.0000 | TOPICAL_OINTMENT | Freq: Four times a day (QID) | CUTANEOUS | 0 refills | Status: DC

## 2022-05-05 MED ORDER — LACTULOSE 10 G PO PACK: 10.0000 g | PACK | Freq: Three times a day (TID) | ORAL | 0 refills | Status: DC

## 2022-05-05 NOTE — ED Provider Notes (Signed)
[facility]-EMERGENCY DEPT Provider Note  CSN: [id number] Arrival date & time: 05/05/22 1827  Chief Complaint(s) Hemorrhoids  HPI Yvonne Lowe is a 71 y.o. female with a history of asthma, irritable bowel syndrome presenting to the emergency department with rectal pain.  Patient reports that she has taken stool softeners without relief.  She reports many prominent hemorrhoids.  She reports that she has pain whenever she attempts to go to the bathroom.  She has taken ibuprofen and Tylenol with mild improvement but persistent pain.  She reports she recently has diarrhea and constipation.  She went to an urgent care who referred her to the emergency department.   Past Medical History Past Medical History:  Diagnosis Date   Allergy    Arthritis    Asthma    Endometriosis    IBS (irritable bowel syndrome)    IBS (irritable bowel syndrome)    Migraine    Multiple allergies    Osteoporosis    Peripheral neuropathy    PPD positive    Sinus disorder    Tinnitus    Wears glasses    Patient Active Problem List   Diagnosis Date Noted   Upper airway cough syndrome 05/23/2021   ACUTE SINUSITIS, UNSPECIFIED 12/01/2009   ALLERGIC RHINITIS 12/01/2009   Home Medication(s) Prior to Admission medications   Medication Sig Start Date End Date Taking? Authorizing Provider  lactulose (CEPHULAC) 10 g packet Take 1 packet (10 g total) by mouth 3 (three) times daily. 05/05/22  Yes Lonell Grandchild, MD  nifedipine 0.3 % ointment Place 1 Application rectally 4 (four) times daily. 05/05/22  Yes Lonell Grandchild, MD  albuterol (PROVENTIL) (2.5 MG/3ML) 0.083% nebulizer solution Take 2.5 mg by nebulization every 6 (six) hours as needed. 12/19/20   [provider]  albuterol (VENTOLIN HFA) 108 (90 Base) MCG/ACT inhaler albuterol sulfate HFA 90 mcg/actuation aerosol inhaler  TAKE 2 PUFFS BY MOUTH EVERY 4 HOURS AS NEEDED    [provider]  azithromycin (ZITHROMAX)  250 MG tablet Take 1 tablet (250 mg total) by mouth daily. 05/23/21   Leslye Peer, MD  citalopram (CELEXA) 10 MG tablet Take 10 mg by mouth daily. 02/27/21   [provider]  gabapentin (NEURONTIN) 300 MG capsule Take 300 mg by mouth 3 (three) times daily.    [provider]  ipratropium (ATROVENT) 0.03 % nasal spray Place 2 sprays into both nostrils every 12 (twelve) hours. 06/06/21   Leslye Peer, MD  levocetirizine (XYZAL) 5 MG tablet Take 5 mg by mouth daily. 02/01/21   [provider]  lubiprostone (AMITIZA) 24 MCG capsule Take 24 mcg by mouth 2 (two) times daily. 02/20/21   [provider]  Magnesium Gluconate 550 MG TABS Take by mouth.    [provider]  methocarbamol (ROBAXIN) 500 MG tablet Take 500 mg by mouth 2 (two) times daily as needed. 01/12/21   [provider]  montelukast (SINGULAIR) 10 MG tablet Take 10 mg by mouth daily. 01/29/21   [provider]  Multiple Vitamin (MULTIVITAMIN) capsule Take by mouth.    [provider]  pantoprazole (PROTONIX) 40 MG tablet TAKE 1 TABLET BY MOUTH EVERY DAY 01/09/22   Leslye Peer, MD  rosuvastatin (CRESTOR) 20 MG tablet Take 20 mg by mouth daily. 01/13/21   [provider]  tiZANidine (ZANAFLEX) 4 MG tablet tizanidine 4 mg tablet    [provider]  Past Surgical History Past Surgical History:  Procedure Laterality Date   COLONOSCOPY     DILATION AND CURETTAGE OF UTERUS  1981   ELBOW FRACTURE SURGERY     KNEE ARTHROSCOPY  2006   left   LARYNGOSCOPY     x3   MULTIPLE TOOTH EXTRACTIONS     NASAL SEPTOPLASTY W/ TURBINOPLASTY Bilateral 01/04/2013   Procedure: SEPTOPLASTY WITH BILATERAL TURBINATE RESECTION ;  Surgeon: Darletta Moll, MD;  Location: [facility];  Service: ENT;  Laterality: Bilateral;   SHOULDER  ARTHROSCOPY  2006   right   TONSILLECTOMY     Family History History reviewed. No pertinent family history.  Social History Social History   Tobacco Use   Smoking status: Former    Packs/day: 1.00    Types: Cigarettes    Quit date: 1988    Years since quitting: 35.7   Smokeless tobacco: Never  Vaping Use   Vaping Use: Never used  Substance Use Topics   Alcohol use: Yes    Comment: occ wine   Drug use: No   Allergies Codeine, Erythromycin, Estradiol, and Venlafaxine  Review of Systems Review of Systems  All other systems reviewed and are negative.   Physical Exam Vital Signs  I have reviewed the triage vital signs BP (!) 138/55 (BP Location: Right Arm)   Pulse 61   Temp 98.4 F (36.9 C) (Oral)   Resp 16   SpO2 98%  Physical Exam Vitals and nursing note reviewed.  Constitutional:      Appearance: Normal appearance.  HENT:     Head: Normocephalic and atraumatic.     Mouth/Throat:     Mouth: Mucous membranes are moist.  Eyes:     Conjunctiva/sclera: Conjunctivae normal.  Cardiovascular:     Rate and Rhythm: Normal rate.  Pulmonary:     Effort: Pulmonary effort is normal. No respiratory distress.  Abdominal:     General: Abdomen is flat.  Genitourinary:    Comments: Chaperoned by RN.  Rectal exam with multiple very large hemorrhoids circumferentially around the rectum, no sign of thrombosis Musculoskeletal:        General: No deformity.  Skin:    General: Skin is warm and dry.     Capillary Refill: Capillary refill takes less than 2 seconds.  Neurological:     General: No focal deficit present.     Mental Status: She is alert. Mental status is at baseline.  Psychiatric:        Mood and Affect: Mood normal.        Behavior: Behavior normal.     ED Results and Treatments Labs (all labs ordered are listed, but only abnormal results are displayed) Labs Reviewed - No data to display                                                                                                                         Radiology No results found.  Pertinent labs &  imaging results that were available during my care of the patient were reviewed by me and considered in my medical decision making (see MDM for details).  Medications Ordered in ED Medications - No data to display                                                                                                                                   Procedures Procedures  (including critical care time)  Medical Decision Making / ED Course   MDM:  71 year old female presenting to the emergency department for rectal pain.  On physical exam, patient has large hemorrhoid burden without evidence of thrombosed hemorrhoid.  She has tried other modalities without significant improvement so we will treat with nifedipine cream.  Recommended MiraLAX but patient reports that she has not been able to tolerate this so we will try lactulose, warned patient that this may cause abdominal cramping.  Will refer to general surgery for possible hemorrhoidectomy. Will discharge patient to home. All questions answered. Patient comfortable with plan of discharge. Return precautions discussed with patient and specified on the after visit summary.       Additional history obtained: -External records from outside source obtained and reviewed including: Chart review including previous notes, labs, imaging, consultation notes    Medicines ordered and prescription drug management: Meds ordered this encounter  Medications   nifedipine 0.3 % ointment    Sig: Place 1 Application rectally 4 (four) times daily.    Dispense:  30 g    Refill:  0   lactulose (CEPHULAC) 10 g packet    Sig: Take 1 packet (10 g total) by mouth 3 (three) times daily.    Dispense:  30 each    Refill:  0    -I have reviewed the patients home medicines and have made adjustments as needed   Social Determinants of Health:  Factors impacting  patients care include: former smoker   Co morbidities that complicate the patient evaluation  Past Medical History:  Diagnosis Date   Allergy    Arthritis    Asthma    Endometriosis    IBS (irritable bowel syndrome)    IBS (irritable bowel syndrome)    Migraine    Multiple allergies    Osteoporosis    Peripheral neuropathy    PPD positive    Sinus disorder    Tinnitus    Wears glasses       Dispostion: Discharge    Final Clinical Impression(s) / ED Diagnoses Final diagnoses:  External hemorrhoids     This chart was dictated using voice recognition software.  Despite best efforts to proofread,  errors can occur which can change the documentation meaning.    Lonell Grandchild, MD 05/05/22 2244

## 2022-05-05 NOTE — ED Provider Triage Note (Signed)
Emergency Medicine Provider Triage Evaluation Note  Yvonne Lowe , a 71 y.o. female  was evaluated in triage.  Pt has a history of IBS and complains of large external hemorrhoids.  Reports that she has been having severe pain over the last few days and that they intermittently bleed.  She was seen at urgent care earlier and they told her to come here because they are the "largest hemorrhoids the provider has ever seen."  They told her she likely needs to see a Careers adviser.  Patient states she is a retired Engineer, civil (consulting).    Review of Systems  Positive: Rectal pain and hemorrhoids Negative: Cessation of bowel movements  Physical Exam  There were no vitals taken for this visit. Gen:   Awake, no distress   Resp:  Normal effort  MSK:   Moves extremities without difficulty  Other:  GU exam deferred in triage  Medical Decision Making  Medically screening exam initiated at 7:25 PM.  Appropriate orders placed.  Yvonne Lowe was informed that the remainder of the evaluation will be completed by another provider, this initial triage assessment does not replace that evaluation, and the importance of remaining in the ED until their evaluation is complete.    Basic labs ordered, GU exam deferred in triage   Yvonne Benders, PA-C 05/05/22 2247

## 2022-05-05 NOTE — ED Triage Notes (Signed)
Pt reports hemorrhoids with swelling, intermittent bleeding, pressure, burning x1 week. Pt denies bleeding at this time. Pt reports constipated.  Tylenol, ibuprofen lidocaine ointment with no relief. Robaxin with relief last night

## 2022-05-05 NOTE — Discharge Instructions (Addendum)
We evaluated you for your hemorrhoids.  Your hemorrhoids are very large there is no evidence of a thrombosis (blood clot).  Cutting into your hemorrhoids would only increase the risk of infection.  I have prescribed you some antispasmodic cream which you can apply to help reduce spasm.  Please also take the additional laxative I have prescribed.  Please follow-up with general surgery for consideration of hemorrhoidectomy.  If your pain worsens or you cannot go to the bathroom, please return to the emergency department.

## 2022-06-13 IMAGING — DX DG CHEST 2V
2 series · 2 of 2 positions shown · non-contrast
Comparison: 03/07/2014

CLINICAL DATA: 70-year-old female with a history of follow-up of
cough

EXAM:
CHEST - 2 VIEW

[chest pa]
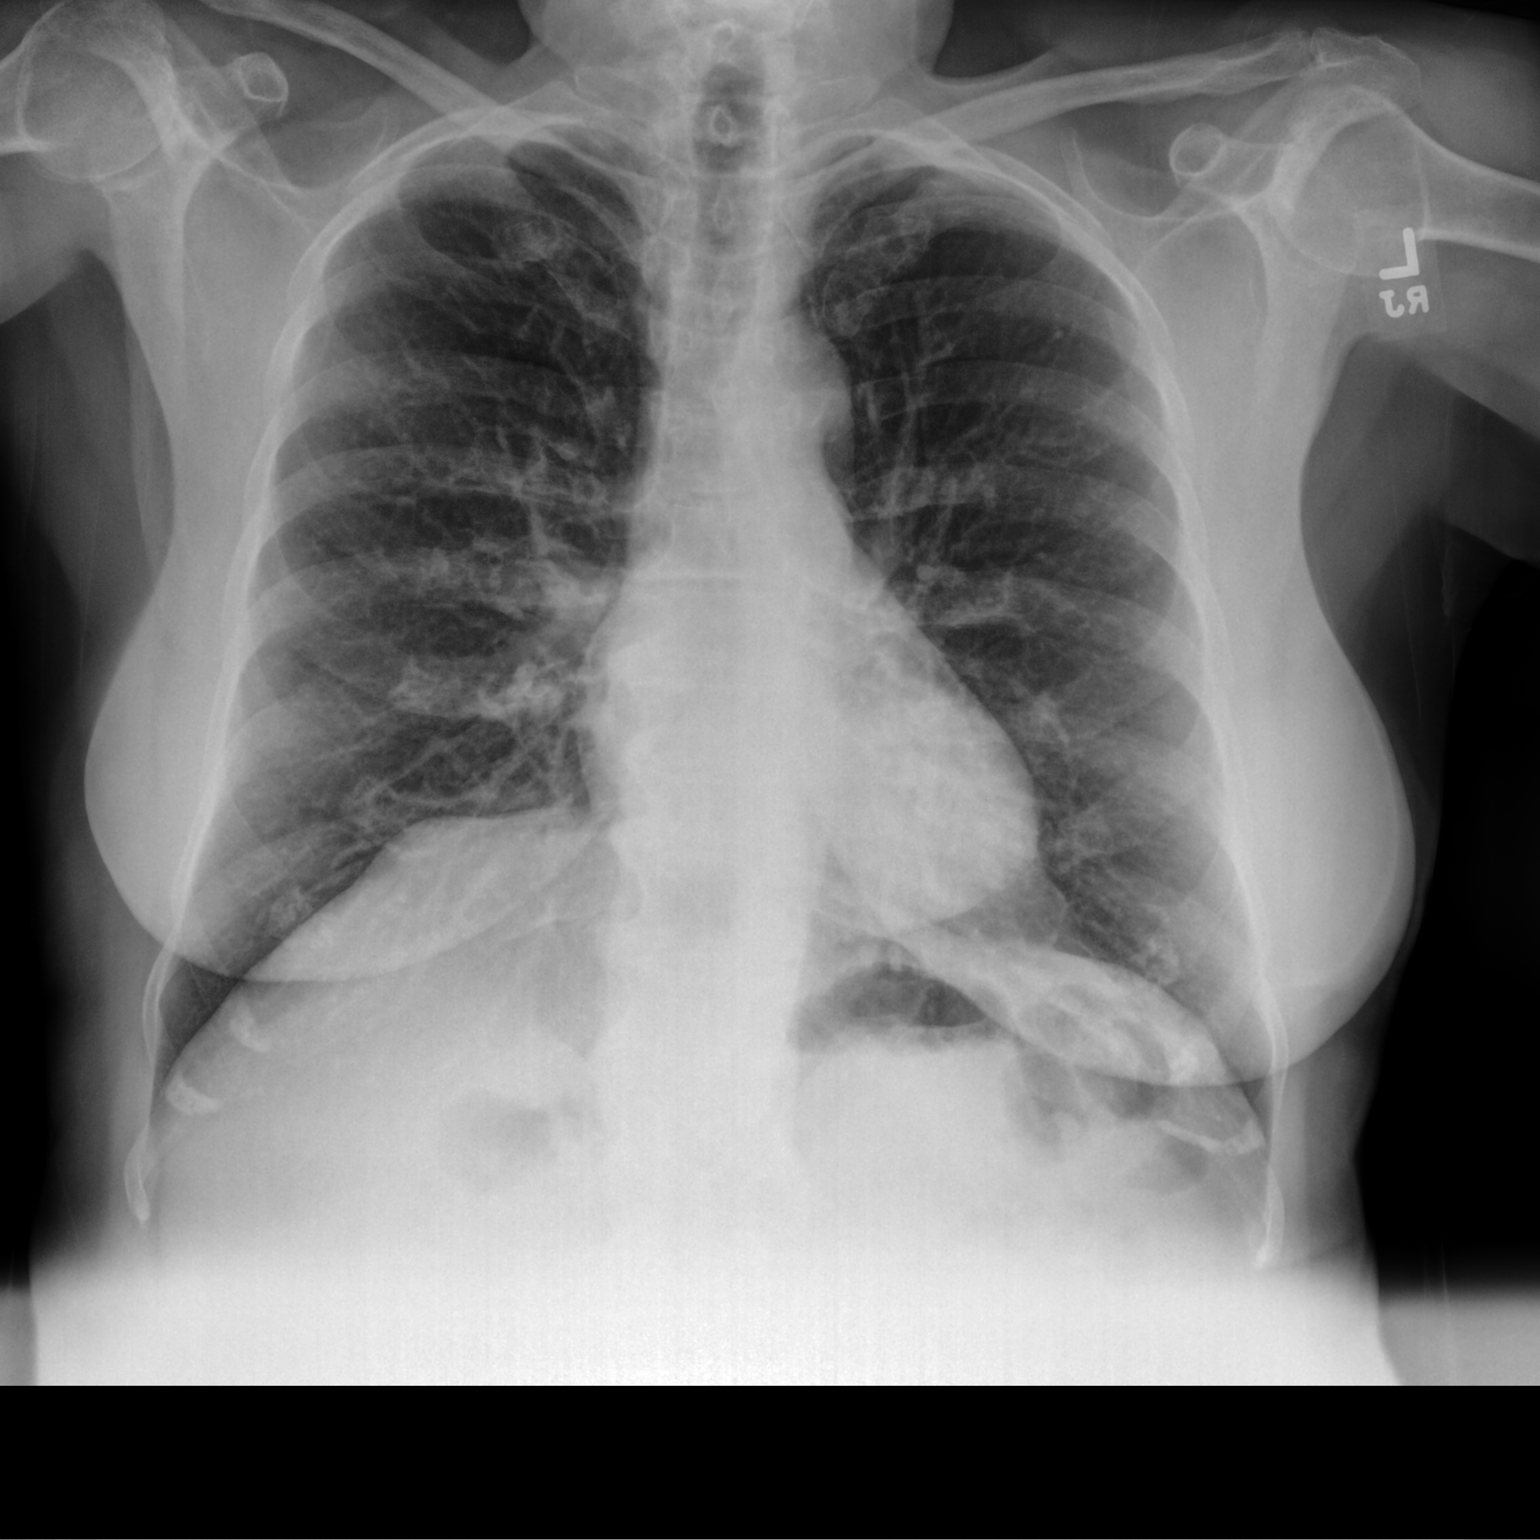

[chest lat]
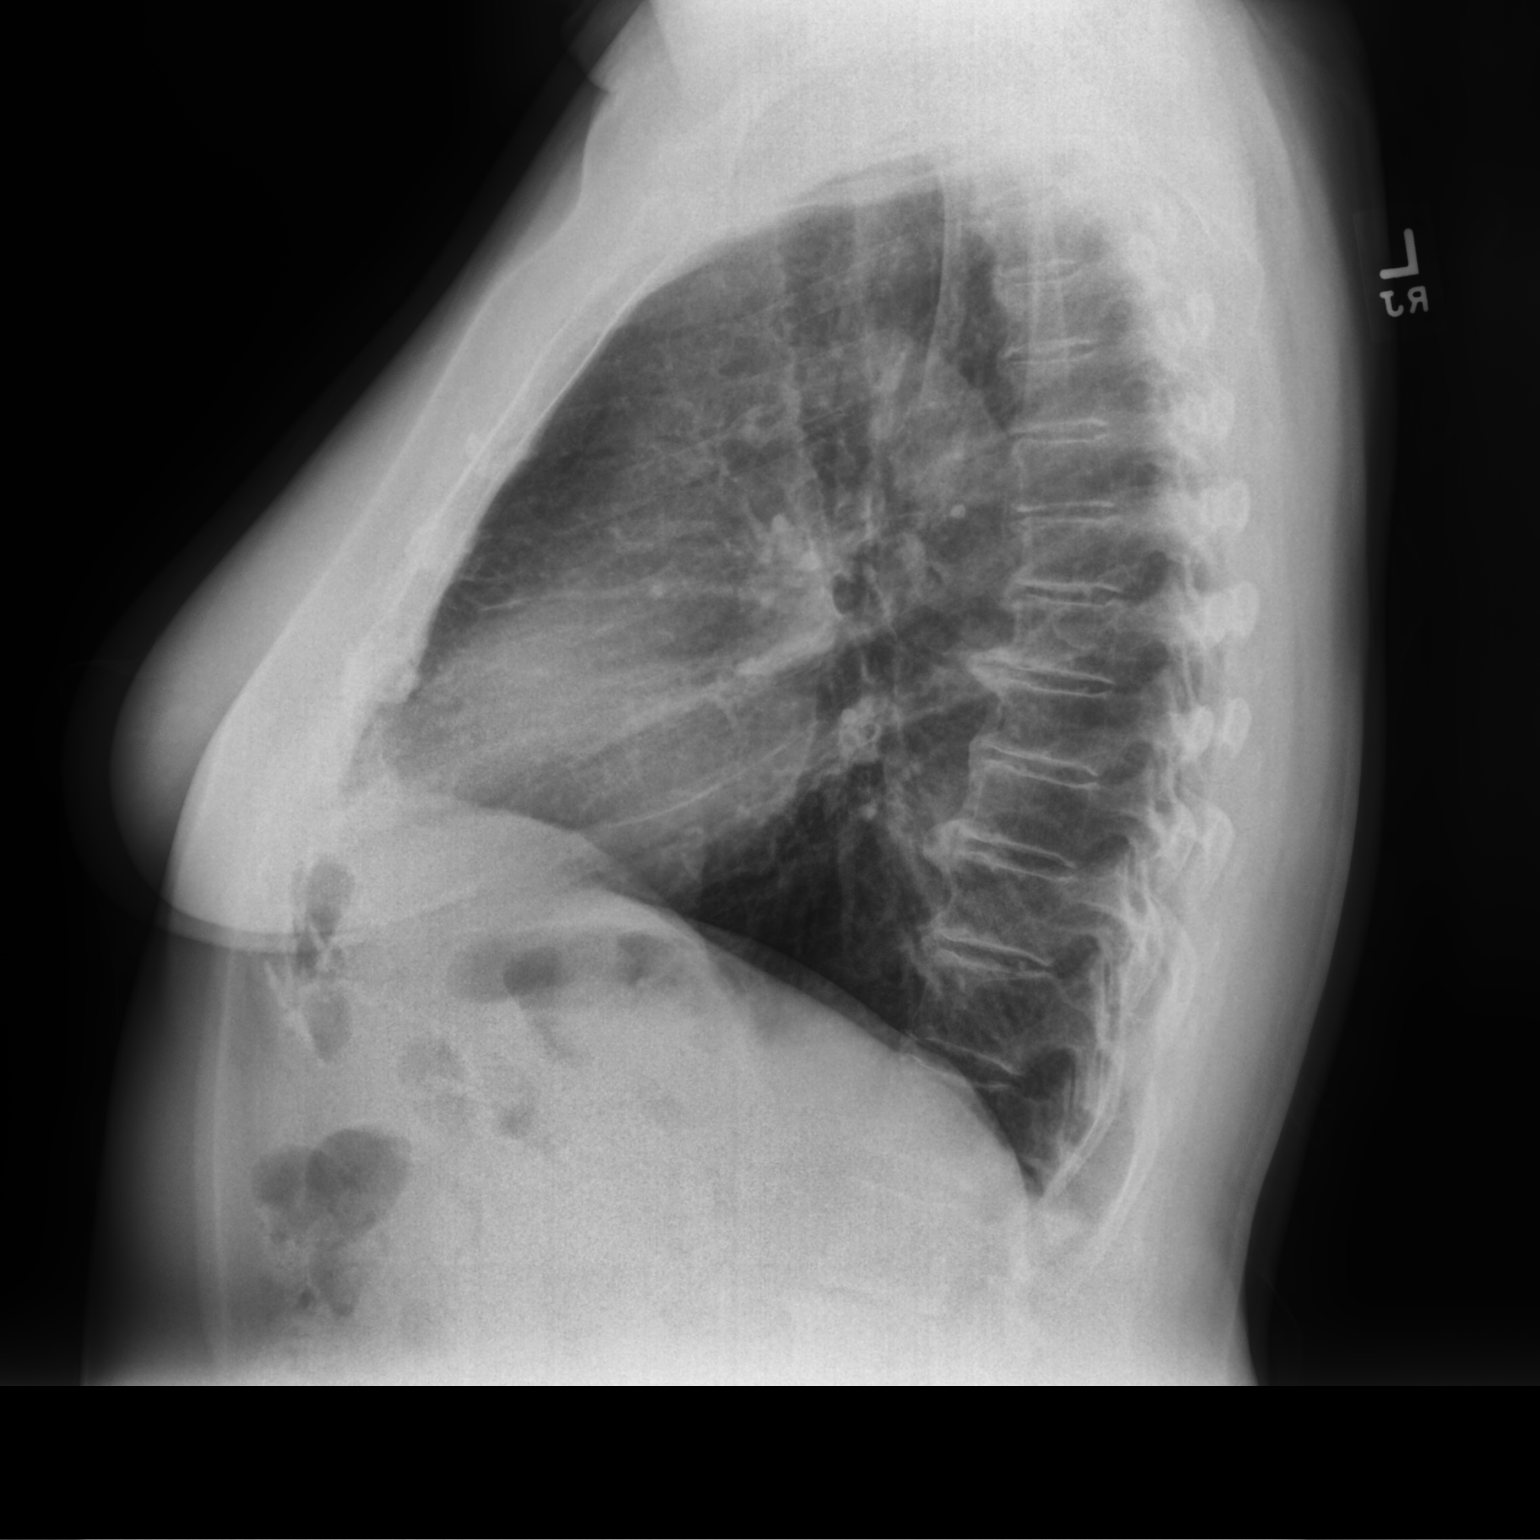

[2 of 2 positions shown; findings below may reference images not displayed]

FINDINGS: Cardiomediastinal silhouette unchanged in size and contour. No
evidence of central vascular congestion. No interlobular septal
thickening.

No pneumothorax or pleural effusion. Coarsened interstitial
markings, with no confluent airspace disease.

No acute displaced fracture. Degenerative changes of the spine.
IMPRESSION: No plain film evidence of acute cardiopulmonary disease

## 2022-06-14 ENCOUNTER — Other Ambulatory Visit: Payer: Self-pay | Admitting: Emergency Medicine

## 2022-08-01 ENCOUNTER — Other Ambulatory Visit: Payer: Self-pay | Admitting: Emergency Medicine

## 2022-09-26 ENCOUNTER — Other Ambulatory Visit: Payer: Self-pay | Admitting: Emergency Medicine

## 2023-02-14 ENCOUNTER — Other Ambulatory Visit: Payer: Self-pay | Admitting: Emergency Medicine

## 2023-03-25 ENCOUNTER — Encounter: Payer: Self-pay | Admitting: Emergency Medicine

## 2023-03-25 ENCOUNTER — Ambulatory Visit (INDEPENDENT_AMBULATORY_CARE_PROVIDER_SITE_OTHER): Payer: 59 | Admitting: Emergency Medicine

## 2023-03-25 VITALS — BP 134/70 | HR 61 | Temp 98.2°F | Ht 63.0 in | Wt 169.2 lb

## 2023-03-25 DIAGNOSIS — R058 Other specified cough: Secondary | ICD-10-CM | POA: Diagnosis not present

## 2023-03-25 NOTE — Assessment & Plan Note (Signed)
Longstanding upper airway irritation syndrome with high rhinitis and mucus burden.  Tried to treat maximally.  She does not have specific antigens so is not on immunotherapy.  Good medical regimen.  I will have her see ENT to make sure there is no anatomical abnormality in the posterior pharynx or glottis.  I think we reasonable to also do a modified barium swallow to ensure no Zenker's diverticulum or other anatomical finding that would contribute to her symptoms.  Her PFTs are reassuring but okay for her to keep albuterol available to use if needed.

## 2023-03-25 NOTE — Progress Notes (Signed)
Subjective:    Patient ID: Yvonne Lowe, female    DOB: [birth year], 72 y.o.   MRN: [id number]  HPI  ROV 11/19/21 --follow-up visit 72 year old woman, former smoker with chronic cough.  Past medical history also significant for IBS, endometriosis, migraines.  She has allergic rhinitis that is a contributor to her cough.  Her pulmonary function testing is reassuring so I stopped her bronchodilator therapy.  She is planning for knee surgery coming up in She reports that her nasal atrovent spray has helped her. She is on xyzal, singulair, phenylephrine. Remains on Protonix. She does have daily cough but better. Rarely uses albuterol, sometimes for cough. No flares or abx.   ROV 03/25/2023 --Yvonne Lowe is 72 and follows up today for history of chronic cough.  She is a former smoker, has a history of IBS, endometriosis, migraines, allergic rhinitis.  Her pulmonary function testing has been reassuring.  She has been treated for both GERD (Protonix) and chronic rhinitis (Xyzal, Singulair, Atrovent nasal spray, phenylephrine/chlorpheniramine). She has seen allergy, but no specific allergens to treat. She has thick mucous in the am when she gets up. Persistent drainage despite her maintenance meds. She does a lot of throat clearing. Uses albuterol 1-2x a day, often for UA soreness or mucous clearance.    Review of Systems As per HPI  Past Medical History:  Diagnosis Date   Allergy    Arthritis    Asthma    Endometriosis    IBS (irritable bowel syndrome)    IBS (irritable bowel syndrome)    Migraine    Multiple allergies    Osteoporosis    Peripheral neuropathy    PPD positive    Sinus disorder    Tinnitus    Wears glasses      No family history on file.   Social History   Socioeconomic History   Marital status: Married    Spouse name: Not on file   Number of children: Not on file   Years of education: Not on file   Highest education level: Not on file  Occupational History   Not on  file  Tobacco Use   Smoking status: Former    Current packs/day: 0.00    Types: Cigarettes    Quit date: 65    Years since quitting: 36.6   Smokeless tobacco: Never  Vaping Use   Vaping status: Never Used  Substance and Sexual Activity   Alcohol use: Yes    Comment: occ wine   Drug use: No   Sexual activity: Not on file  Other Topics Concern   Not on file  Social History Narrative   Not on file   Social Determinants of Health   Financial Resource Strain: Not on file  Food Insecurity: Not on file  Transportation Needs: Not on file  Physical Activity: Not on file  Stress: Not on file  Social Connections: Not on file  Intimate Partner Violence: Not on file    [place] lived [place], [place], [place], [place]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  Hx positive PPD  Allergies  Allergen Reactions   Codeine Other (See Comments)    Headaches with larger doses Headaches with larger doses   Erythromycin Nausea And Vomiting and Other (See Comments)   Estradiol Other (See Comments)    REACTION: Paranoid REACTION: Paranoid    Venlafaxine Other (See Comments)    headaches     Outpatient Medications Prior to Visit  Medication Sig Dispense Refill  albuterol (PROVENTIL) (2.5 MG/3ML) 0.083% nebulizer solution Take 2.5 mg by nebulization every 6 (six) hours as needed.     albuterol (VENTOLIN HFA) 108 (90 Base) MCG/ACT inhaler albuterol sulfate HFA 90 mcg/actuation aerosol inhaler  TAKE 2 PUFFS BY MOUTH EVERY 4 HOURS AS NEEDED     citalopram (CELEXA) 10 MG tablet Take 10 mg by mouth daily.     gabapentin (NEURONTIN) 300 MG capsule Take 300 mg by mouth 3 (three) times daily.     ipratropium (ATROVENT) 0.03 % nasal spray USE 2 SPRAYS IN BOTH NOSTRILS EVERY 12 HOURS. 90 mL 1   levocetirizine (XYZAL) 5 MG tablet Take 5 mg by mouth daily.     lubiprostone (AMITIZA) 24 MCG capsule Take 24 mcg by mouth 2 (two) times daily.     Magnesium Gluconate 550 MG TABS Take by mouth.     methocarbamol (ROBAXIN) 500 MG  tablet Take 500 mg by mouth 2 (two) times daily as needed.     montelukast (SINGULAIR) 10 MG tablet Take 10 mg by mouth daily.     Multiple Vitamin (MULTIVITAMIN) capsule Take by mouth.     pantoprazole (PROTONIX) 40 MG tablet TAKE 1 TABLET BY MOUTH EVERY DAY 90 tablet 1   rosuvastatin (CRESTOR) 20 MG tablet Take 20 mg by mouth daily.     tiZANidine (ZANAFLEX) 4 MG tablet tizanidine 4 mg tablet     azithromycin (ZITHROMAX) 250 MG tablet Take 1 tablet (250 mg total) by mouth daily. 6 tablet 0   lactulose (CEPHULAC) 10 g packet Take 1 packet (10 g total) by mouth 3 (three) times daily. 30 each 0   nifedipine 0.3 % ointment Place 1 Application rectally 4 (four) times daily. 30 g 0   No facility-administered medications prior to visit.         Objective:   Physical Exam  Vitals:   03/25/23 1335  BP: 134/70  Pulse: 61  Temp: 98.2 F (36.8 C)  TempSrc: Oral  SpO2: 99%  Weight: 169 lb 3.2 oz (76.7 kg)  Height: 5\' 3"  (1.6 m)   Gen: Pleasant, overwt woman, in no distress,  normal affect. Less coughing  ENT: No lesions,  mouth clear,  oropharynx clear, stronger voice today  Neck: No JVD, no stridor  Lungs: No use of accessory muscles, no crackles or wheezing on normal respiration, no wheeze on forced expiration  Cardiovascular: RRR, heart sounds normal, no murmur or gallops, no peripheral edema  Musculoskeletal: No deformities, no cyanosis or clubbing  Neuro: alert, awake, non focal  Skin: Warm, no lesions or rash     Assessment & Plan:   Upper airway cough syndrome Longstanding upper airway irritation syndrome with high rhinitis and mucus burden.  Tried to treat maximally.  She does not have specific antigens so is not on immunotherapy.  Good medical regimen.  I will have her see ENT to make sure there is no anatomical abnormality in the posterior pharynx or glottis.  I think we reasonable to also do a modified barium swallow to ensure no Zenker's diverticulum or other  anatomical finding that would contribute to her symptoms.  Her PFTs are reassuring but okay for her to keep albuterol available to use if needed.    Levy Pupa, MD, PhD 03/25/2023, 1:55 PM [facility] [phone] or if no answer before 7:00PM call [phone] For any issues after 7:00PM please call eLink [phone]

## 2023-03-25 NOTE — Patient Instructions (Addendum)
Please continue your Protonix as you have been taking it Please continue your allergy medication as you have been taking them (Xyzal, Singulair, ipratropium nasal spray, antihistamines) We will refer you to ENT for an upper airway inspection to ensure there is no anatomical abnormality We will perform a modified barium swallow test to evaluate for any throat or esophageal abnormality that might contribute to reflux, throat irritation or cough. Keep your albuterol available to use 2 puffs when needed for shortness of breath, chest tightness, wheezing. Follow with Dr. Delton Coombes in 2-3 months so we can review your testing.

## 2023-03-26 ENCOUNTER — Other Ambulatory Visit (HOSPITAL_COMMUNITY): Payer: Self-pay | Admitting: *Deleted

## 2023-03-26 DIAGNOSIS — R059 Cough, unspecified: Secondary | ICD-10-CM

## 2023-03-26 DIAGNOSIS — R1310 Dysphagia, unspecified: Secondary | ICD-10-CM

## 2023-03-31 ENCOUNTER — Ambulatory Visit (HOSPITAL_COMMUNITY): Admission: RE | Admit: 2023-03-31 | Payer: 59 | Source: Ambulatory Visit

## 2023-03-31 ENCOUNTER — Ambulatory Visit (HOSPITAL_COMMUNITY)
Admission: RE | Admit: 2023-03-31 | Discharge: 2023-03-31 | Disposition: A | Discharge status: Home / Self Care | Payer: 59 | Source: Ambulatory Visit | Attending: Family Medicine | Admitting: Family Medicine

## 2023-03-31 DIAGNOSIS — R490 Dysphonia: Secondary | ICD-10-CM | POA: Diagnosis present

## 2023-03-31 DIAGNOSIS — R1310 Dysphagia, unspecified: Secondary | ICD-10-CM | POA: Diagnosis not present

## 2023-03-31 DIAGNOSIS — R0989 Other specified symptoms and signs involving the circulatory and respiratory systems: Secondary | ICD-10-CM | POA: Diagnosis not present

## 2023-03-31 DIAGNOSIS — R059 Cough, unspecified: Secondary | ICD-10-CM

## 2023-03-31 DIAGNOSIS — R058 Other specified cough: Secondary | ICD-10-CM

## 2023-03-31 DIAGNOSIS — J45909 Unspecified asthma, uncomplicated: Secondary | ICD-10-CM | POA: Insufficient documentation

## 2023-04-09 ENCOUNTER — Other Ambulatory Visit: Payer: Self-pay | Admitting: Emergency Medicine

## 2023-07-21 ENCOUNTER — Other Ambulatory Visit: Payer: Self-pay | Admitting: Emergency Medicine

## 2023-08-06 ENCOUNTER — Ambulatory Visit: Payer: 59 | Admitting: Emergency Medicine

## 2023-10-08 ENCOUNTER — Ambulatory Visit: Payer: 59 | Admitting: Emergency Medicine

## 2023-10-08 ENCOUNTER — Encounter: Payer: Self-pay | Admitting: Emergency Medicine

## 2023-10-08 VITALS — BP 112/68 | HR 63 | Ht 63.0 in | Wt 169.6 lb

## 2023-10-08 DIAGNOSIS — R058 Other specified cough: Secondary | ICD-10-CM | POA: Diagnosis not present

## 2023-10-08 NOTE — Patient Instructions (Signed)
Please continue your Xyzal, Singulair and Atrovent nasal spray as you have been taking them You should chlorpheniramine as needed Continue your pantoprazole 40 mg once daily.  You could consider increasing this to twice daily to see if it impacts your cough and throat irritation Continue the Neurontin We discussed possible bronchoscopy and airway inspection today.  We will hold off for now but may decide to consider at some point going forward. Try using sugar-free candy or cough drop (non-mentholated) to avoid throat clearing.  Keep this in your mouth and when you have the need to clear your throat, just swallow. Follow with Dr Delton Coombes in 6 months or sooner if you have any problems

## 2023-10-08 NOTE — Progress Notes (Signed)
Subjective:    Patient ID: Yvonne Lowe, female    DOB: [birth year], 73 y.o.   MRN: [id number]  HPI  ROV 11/19/21 --follow-up visit 73 year old woman, former smoker with chronic cough.  Past medical history also significant for IBS, endometriosis, migraines.  She has allergic rhinitis that is a contributor to her cough.  Her pulmonary function testing is reassuring so I stopped her bronchodilator therapy.  She is planning for knee surgery coming up in She reports that her nasal atrovent spray has helped her. She is on xyzal, singulair, phenylephrine. Remains on Protonix. She does have daily cough but better. Rarely uses albuterol, sometimes for cough. No flares or abx.   ROV 03/25/2023 --Yvonne Lowe is 33 and follows up today for history of chronic cough.  She is a former smoker, has a history of IBS, endometriosis, migraines, allergic rhinitis.  Her pulmonary function testing has been reassuring.  She has been treated for both GERD (Protonix) and chronic rhinitis (Xyzal, Singulair, Atrovent nasal spray, phenylephrine/chlorpheniramine). She has seen allergy, but no specific allergens to treat. She has thick mucous in the am when she gets up. Persistent drainage despite her maintenance meds. She does a lot of throat clearing. Uses albuterol 1-2x a day, often for UA soreness or mucous clearance.   ROV 10/08/2023 --follow-up visit for 73 year old woman with chronic cough.  Has a history of former tobacco, IBS, endometriosis, migraines, rhinitis.  Pulmonary function testing is reassuring.  She has been treated empirically for GERD in rhinitis, has seen allergist but no specific allergen to treat.  She had a modified barium swallow done 03/31/2023 that showed a normal functional swallow.  No evidence of Zenker's diverticulum or any other anatomical finding. She does frequent throat clearing. Lots of nasal gtt and congestion. She is on xyzal, singulair, atrovent NS about 2x a day. Occasional breakthrough reflux on the  protonix every day. On neurontin. Rare albuterol use. She did not see ENT because they did not take her insurance.    Review of Systems As per HPI  Past Medical History:  Diagnosis Date   Allergy    Arthritis    Asthma    Endometriosis    IBS (irritable bowel syndrome)    IBS (irritable bowel syndrome)    Migraine    Multiple allergies    Osteoporosis    Peripheral neuropathy    PPD positive    Sinus disorder    Tinnitus    Wears glasses      No family history on file.   Social History   Socioeconomic History   Marital status: Married    Spouse name: Not on file   Number of children: Not on file   Years of education: Not on file   Highest education level: Not on file  Occupational History   Not on file  Tobacco Use   Smoking status: Former    Current packs/day: 0.00    Types: Cigarettes    Quit date: 66    Years since quitting: 37.1   Smokeless tobacco: Never  Vaping Use   Vaping status: Never Used  Substance and Sexual Activity   Alcohol use: Yes    Comment: occ wine   Drug use: No   Sexual activity: Not on file  Other Topics Concern   Not on file  Social History Narrative   Not on file   Social Drivers of Health   Financial Resource Strain: Not on file  Food Insecurity: Not on file  Transportation Needs: Not on file  Physical Activity: Not on file  Stress: Not on file  Social Connections: Not on file  Intimate Partner Violence: Not on file    [place] lived in [place], [place], [place], [place]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  Hx positive PPD  Allergies  Allergen Reactions   Codeine Other (See Comments)    Headaches with larger doses Headaches with larger doses   Erythromycin Nausea And Vomiting and Other (See Comments)   Estradiol Other (See Comments)    REACTION: Paranoid REACTION: Paranoid    Venlafaxine Other (See Comments)    headaches     Outpatient Medications Prior to Visit  Medication Sig Dispense Refill   albuterol (PROVENTIL) (2.5 MG/3ML)  0.083% nebulizer solution Take 2.5 mg by nebulization every 6 (six) hours as needed.     albuterol (VENTOLIN HFA) 108 (90 Base) MCG/ACT inhaler albuterol sulfate HFA 90 mcg/actuation aerosol inhaler  TAKE 2 PUFFS BY MOUTH EVERY 4 HOURS AS NEEDED     citalopram (CELEXA) 10 MG tablet Take 10 mg by mouth daily.     gabapentin (NEURONTIN) 300 MG capsule Take 300 mg by mouth 3 (three) times daily.     ipratropium (ATROVENT) 0.03 % nasal spray USE 2 SPRAYS IN BOTH NOSTRILS EVERY 12 HOURS. 90 mL 1   levocetirizine (XYZAL) 5 MG tablet Take 5 mg by mouth daily.     lubiprostone (AMITIZA) 24 MCG capsule Take 24 mcg by mouth 2 (two) times daily.     Magnesium Gluconate 550 MG TABS Take by mouth.     methocarbamol (ROBAXIN) 500 MG tablet Take 500 mg by mouth 2 (two) times daily as needed.     montelukast (SINGULAIR) 10 MG tablet Take 10 mg by mouth daily.     Multiple Vitamin (MULTIVITAMIN) capsule Take by mouth.     pantoprazole (PROTONIX) 40 MG tablet TAKE 1 TABLET BY MOUTH EVERY DAY 90 tablet 1   rosuvastatin (CRESTOR) 20 MG tablet Take 20 mg by mouth daily.     tiZANidine (ZANAFLEX) 4 MG tablet tizanidine 4 mg tablet     No facility-administered medications prior to visit.         Objective:   Physical Exam  Vitals:   10/08/23 1442  BP: 112/68  Pulse: 63  SpO2: 97%  Weight: 169 lb 9.6 oz (76.9 kg)  Height: 5\' 3"  (1.6 m)   Gen: Pleasant, overwt woman, in no distress,  normal affect. Less coughing  ENT: No lesions,  mouth clear,  oropharynx clear, stronger voice today  Neck: No JVD, no stridor  Lungs: No use of accessory muscles, no crackles or wheezing on normal respiration, no wheeze on forced expiration  Cardiovascular: RRR, heart sounds normal, no murmur or gallops, no peripheral edema  Musculoskeletal: No deformities, no cyanosis or clubbing  Neuro: alert, awake, non focal  Skin: Warm, no lesions or rash     Assessment & Plan:   Upper airway cough syndrome It seems of  the large contributor here is rhinitis and congestion.  She has incessant need to clear her throat.  She has tried multiple modalities.  Her swallowing evaluation was reassuring.  She does occasionally have some breakthrough GERD.  We talked today about bronchoscopy to rule out an upper airway culprit lesion.  She wants to think about this  Please continue your Xyzal, Singulair and Atrovent nasal spray as you have been taking them You should chlorpheniramine as needed Continue your pantoprazole 40 mg once  daily.  You could consider increasing this to twice daily to see if it impacts your cough and throat irritation Continue the Neurontin We discussed possible bronchoscopy and airway inspection today.  We will hold off for now but may decide to consider at some point going forward. Try using sugar-free candy or cough drop (non-mentholated) to avoid throat clearing.  Keep this in your mouth and when you have the need to clear your throat, just swallow. Follow with Dr Delton Coombes in 6 months or sooner if you have any problems     Levy Pupa, MD, PhD 10/08/2023, 3:08 PM [facility] [phone] or if no answer before 7:00PM call [phone] For any issues after 7:00PM please call eLink [phone]

## 2023-10-08 NOTE — Assessment & Plan Note (Signed)
It seems of the large contributor here is rhinitis and congestion.  She has incessant need to clear her throat.  She has tried multiple modalities.  Her swallowing evaluation was reassuring.  She does occasionally have some breakthrough GERD.  We talked today about bronchoscopy to rule out an upper airway culprit lesion.  She wants to think about this  Please continue your Xyzal, Singulair and Atrovent nasal spray as you have been taking them You should chlorpheniramine as needed Continue your pantoprazole 40 mg once daily.  You could consider increasing this to twice daily to see if it impacts your cough and throat irritation Continue the Neurontin We discussed possible bronchoscopy and airway inspection today.  We will hold off for now but may decide to consider at some point going forward. Try using sugar-free candy or cough drop (non-mentholated) to avoid throat clearing.  Keep this in your mouth and when you have the need to clear your throat, just swallow. Follow with Dr Delton Coombes in 6 months or sooner if you have any problems

## 2023-12-28 ENCOUNTER — Other Ambulatory Visit: Payer: Self-pay | Admitting: Emergency Medicine

## 2024-03-07 ENCOUNTER — Other Ambulatory Visit: Payer: Self-pay | Admitting: Emergency Medicine

## 2024-07-28 ENCOUNTER — Ambulatory Visit: Admitting: Emergency Medicine

## 2024-07-28 DIAGNOSIS — R058 Other specified cough: Secondary | ICD-10-CM

## 2024-08-11 ENCOUNTER — Ambulatory Visit: Admitting: Emergency Medicine

## 2024-09-07 ENCOUNTER — Encounter: Payer: Self-pay | Admitting: Emergency Medicine

## 2024-09-07 ENCOUNTER — Ambulatory Visit: Admitting: Emergency Medicine
